# Patient Record
Sex: Female | Born: 1965 | Race: White | Hispanic: No | Marital: Married | Smoking: Current every day smoker
Health system: Southern US, Community
[De-identification: ages and names within clinical notes are randomized; demographics above are authoritative.]

## PROBLEM LIST (undated history)

## (undated) DIAGNOSIS — N838 Other noninflammatory disorders of ovary, fallopian tube and broad ligament: Secondary | ICD-10-CM

## (undated) DIAGNOSIS — J45909 Unspecified asthma, uncomplicated: Secondary | ICD-10-CM

## (undated) DIAGNOSIS — F329 Major depressive disorder, single episode, unspecified: Secondary | ICD-10-CM

## (undated) DIAGNOSIS — J189 Pneumonia, unspecified organism: Secondary | ICD-10-CM

## (undated) DIAGNOSIS — R519 Headache, unspecified: Secondary | ICD-10-CM

## (undated) DIAGNOSIS — E0590 Thyrotoxicosis, unspecified without thyrotoxic crisis or storm: Secondary | ICD-10-CM

## (undated) DIAGNOSIS — I10 Essential (primary) hypertension: Secondary | ICD-10-CM

## (undated) DIAGNOSIS — M1990 Unspecified osteoarthritis, unspecified site: Secondary | ICD-10-CM

## (undated) DIAGNOSIS — R51 Headache: Secondary | ICD-10-CM

## (undated) DIAGNOSIS — K219 Gastro-esophageal reflux disease without esophagitis: Secondary | ICD-10-CM

## (undated) DIAGNOSIS — E039 Hypothyroidism, unspecified: Secondary | ICD-10-CM

## (undated) DIAGNOSIS — F419 Anxiety disorder, unspecified: Secondary | ICD-10-CM

## (undated) DIAGNOSIS — F32A Depression, unspecified: Secondary | ICD-10-CM

## (undated) HISTORY — DX: Thyrotoxicosis, unspecified without thyrotoxic crisis or storm: E05.90

## (undated) HISTORY — PX: CHOLECYSTECTOMY: SHX55

## (undated) HISTORY — PX: TONSILLECTOMY: SUR1361

## (undated) HISTORY — PX: TUBAL LIGATION: SHX77

## (undated) HISTORY — PX: VARICOSE VEIN SURGERY: SHX832

## (undated) HISTORY — PX: WRIST SURGERY: SHX841

## (undated) HISTORY — PX: ABDOMINAL HYSTERECTOMY: SHX81

## (undated) HISTORY — PX: FINGER GANGLION CYST EXCISION: SHX1636

---

## 1998-06-30 ENCOUNTER — Other Ambulatory Visit: Admission: RE | Admit: 1998-06-30 | Discharge: 1998-06-30 | Payer: Self-pay | Admitting: Obstetrics & Gynecology

## 1999-01-22 ENCOUNTER — Encounter: Payer: Self-pay | Admitting: Vascular Surgery

## 1999-01-29 ENCOUNTER — Encounter (INDEPENDENT_AMBULATORY_CARE_PROVIDER_SITE_OTHER): Payer: Self-pay | Admitting: Specialist

## 1999-01-29 ENCOUNTER — Ambulatory Visit (HOSPITAL_COMMUNITY): Admission: RE | Admit: 1999-01-29 | Discharge: 1999-01-29 | Payer: Self-pay | Admitting: Vascular Surgery

## 1999-06-16 ENCOUNTER — Other Ambulatory Visit: Admission: RE | Admit: 1999-06-16 | Discharge: 1999-06-16 | Payer: Self-pay | Admitting: Obstetrics & Gynecology

## 1999-07-09 ENCOUNTER — Ambulatory Visit (HOSPITAL_COMMUNITY): Admission: RE | Admit: 1999-07-09 | Discharge: 1999-07-09 | Payer: Self-pay | Admitting: Obstetrics & Gynecology

## 2000-07-12 ENCOUNTER — Other Ambulatory Visit: Admission: RE | Admit: 2000-07-12 | Discharge: 2000-07-12 | Payer: Self-pay | Admitting: Obstetrics & Gynecology

## 2001-04-27 ENCOUNTER — Encounter (INDEPENDENT_AMBULATORY_CARE_PROVIDER_SITE_OTHER): Payer: Self-pay

## 2001-04-27 ENCOUNTER — Inpatient Hospital Stay (HOSPITAL_COMMUNITY): Admission: RE | Admit: 2001-04-27 | Discharge: 2001-04-29 | Payer: Self-pay | Admitting: Obstetrics & Gynecology

## 2003-03-27 ENCOUNTER — Encounter (INDEPENDENT_AMBULATORY_CARE_PROVIDER_SITE_OTHER): Payer: Self-pay | Admitting: *Deleted

## 2003-03-27 ENCOUNTER — Ambulatory Visit (HOSPITAL_COMMUNITY): Admission: RE | Admit: 2003-03-27 | Discharge: 2003-03-27 | Payer: Self-pay | Admitting: *Deleted

## 2005-07-22 ENCOUNTER — Other Ambulatory Visit: Admission: RE | Admit: 2005-07-22 | Discharge: 2005-07-22 | Payer: Self-pay | Admitting: Obstetrics & Gynecology

## 2007-11-09 ENCOUNTER — Ambulatory Visit (HOSPITAL_BASED_OUTPATIENT_CLINIC_OR_DEPARTMENT_OTHER): Admission: RE | Admit: 2007-11-09 | Discharge: 2007-11-09 | Payer: Self-pay | Admitting: Orthopedic Surgery

## 2007-11-09 ENCOUNTER — Encounter (INDEPENDENT_AMBULATORY_CARE_PROVIDER_SITE_OTHER): Payer: Self-pay | Admitting: Orthopedic Surgery

## 2009-03-07 ENCOUNTER — Ambulatory Visit: Payer: Self-pay | Admitting: Gastroenterology

## 2009-03-07 DIAGNOSIS — R109 Unspecified abdominal pain: Secondary | ICD-10-CM | POA: Insufficient documentation

## 2009-03-07 LAB — CONVERTED CEMR LAB
ALT: 16 units/L (ref 0–35)
AST: 18 units/L (ref 0–37)
Albumin: 4 g/dL (ref 3.5–5.2)
Alkaline Phosphatase: 60 units/L (ref 39–117)
BUN: 16 mg/dL (ref 6–23)
Basophils Absolute: 0 10*3/uL (ref 0.0–0.1)
Basophils Relative: 0.1 % (ref 0.0–3.0)
CO2: 29 meq/L (ref 19–32)
Calcium: 9.4 mg/dL (ref 8.4–10.5)
Chloride: 105 meq/L (ref 96–112)
Creatinine, Ser: 0.8 mg/dL (ref 0.4–1.2)
Eosinophils Absolute: 0 10*3/uL (ref 0.0–0.7)
Eosinophils Relative: 0.1 % (ref 0.0–5.0)
GFR calc non Af Amer: 82.94 mL/min (ref >60)
Glucose, Bld: 90 mg/dL (ref 70–99)
HCT: 37.5 % (ref 36.0–46.0)
Hemoglobin: 13.4 g/dL (ref 12.0–15.0)
IgA: 126 mg/dL (ref 68–378)
Lymphocytes Relative: 21.4 % (ref 12.0–46.0)
Lymphs Abs: 1.2 10*3/uL (ref 0.7–4.0)
MCHC: 35.7 g/dL (ref 30.0–36.0)
MCV: 88.7 fL (ref 78.0–100.0)
Monocytes Absolute: 0.3 10*3/uL (ref 0.1–1.0)
Monocytes Relative: 6.1 % (ref 3.0–12.0)
Neutro Abs: 4 10*3/uL (ref 1.4–7.7)
Neutrophils Relative %: 72.3 % (ref 43.0–77.0)
Platelets: 253 10*3/uL (ref 150.0–400.0)
Potassium: 3.7 meq/L (ref 3.5–5.1)
RBC: 4.23 M/uL (ref 3.87–5.11)
RDW: 13.2 % (ref 11.5–14.6)
Sodium: 140 meq/L (ref 135–145)
Tissue Transglutaminase Ab, IgA: 0.3 units (ref <7)
Total Bilirubin: 0.9 mg/dL (ref 0.3–1.2)
Total Protein: 7.1 g/dL (ref 6.0–8.3)
WBC: 5.5 10*3/uL (ref 4.5–10.5)

## 2009-04-08 ENCOUNTER — Telehealth: Payer: Self-pay | Admitting: Gastroenterology

## 2010-12-15 NOTE — Op Note (Signed)
Lynn Newman, Lynn Newman              ACCOUNT NO.:  [id number]   MEDICAL RECORD NO.:  [id number]          PATIENT TYPE:  AMB   LOCATION:  DSC                          FACILITY:  [facility]   PHYSICIAN:  Katy Fitch. Sypher, M.D. 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   OPERATION:  1. Excision of left trapezium with synovectomy of carpometacarpal      joint and removal of loose bodies.  2. A #1 Vicryl suture suspension plasty.  3. Reconstruction of index thumb intermetacarpal ligament utilizing a      free palmaris longus tendon graft.   OPERATING SURGEON:  Katy Fitch. Sypher, MD   ASSISTANT:  Lynn Reeks. Dasnoit, PA-C.   ANESTHESIA:  General by LMA technique supplemented by a left  interscalene block.   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 Jonny Ruiz Rendall for evaluation and  management of chronically painful left thumb CMC arthrosis.  Clinical  examination revealed crepitation and marked pain with CMC translation  grind and flexion extension.  Dr. Priscille Kluver had provided 2 steroid  injections without lasting relief.  I tried a third injection, placed  supine within the depth of the joint also without relief.  After  detailed informed consent during which Lynn Newman was advised of the  potential risks and benefits of [name], she was brought to the  operating at this time.   Preoperatively, we discussed in great detail the occasional occurrence  of a regional pain syndrome and/or reflex sympathetic dystrophy type  changes that can occur following  thumb suspension plasty.   While the etiology of this is unknown, there is an increased frequency  of this complication with the surgery.   Preoperatively, she is advised that there is a possibility that we would  need to provide sympathetic blocks and more therapy than originally  anticipated should this occur.   She also understands that there are risks of infection, failure of the  tendon graft, and possible neurovascular injury.   After questions were answered, she is brought to the operating room at  this time.   PROCEDURE:  Lynn Newman is brought to the operating room and placed  in supine position on the operating table.   Following an anesthesia consult with Dr. Sampson Goon, a left interscalene  block was placed for perioperative comfort.  There were no signs of  complication following the block.   Lynn Newman was brought to room 8 of the [facility], placed in  supine position on the operating table, and under Dr. Jarrett Ables  direct supervision, general anesthesia by LMA technique induced.   The left upper extremity was prepped with Betadine soap solution,  sterilely draped.  1 g of Ancef was administered as an IV prophylactic  antibiotic.   Procedure commenced with a short curvilinear incision paralleling the  glabrous skin margin of the thenar eminence.  Subcutaneous tissues were  carefully divided taking care to identify the radial superficial sensory  branches.  These were meticulously retracted.  The interval between the  abductor pollicis longus, dorsal slip, and the extensor pollicis brevis  were sharply developed revealing the capsule of the [facility] joint.  Taking  care to identify the radial artery, the trapezium was exposed  subperiosteally with sharp osteotomes and use of tenotomy scissors.  The  trapezium was morselized with a small osteotome and removed piecemeal  completely with a rongeur.  A synovectomy CMC joint was accomplished and  removal of  multiple loose bodies between the thumb metacarpal and index  metacarpal was accomplished.   Drill holes were created through the base of the thumb metacarpal  beginning 1 cm over the dorsal cortex and exiting out the central  portion of the metacarpal base and across the index metacarpal just  distal to the articular facet for the thumb metacarpal and extending to  the insertion of the extensor carpi radialis longus.   These were enlarged to 4 mm by sequential hand drilling.   A #1 Vicryl suture was then placed through the insertion of the extensor  carpi radialis longus, brought through the index metacarpal from dorsal  to palmar, and then up from proximal to distal through the base of the  thumb metacarpal to provide a suspension plasty.  The palmaris longus  was harvested through a short transverse incision at the distal wrist  flexion crease.  This was stripped of muscle and stored in a saline-  soaked sponge.  A double-stranded graft of the palmaris longus was then  placed through the drill hole in the base of the index metacarpal and  secured with a 2-0 FiberWire to the extensor carpi radialis longus  insertion followed by placement of the 2 free tails through the base of  the thumb metacarpal creating an intermetacarpal ligament.   One tail was then wrapped around the 2 tendon slips between the 2  metacarpals creating an overhand knot for interposition.  The free tail  was then woven into the abductor pollicis longus with a 2-pass palmar  tap weave followed by a square knot of the free ends beneath the thumb  metacarpal.   The Vicryl suture was tensioned to allow approximately 3 mm of play at  the intermetacarpal ligament reconstruction.   The Vicryl was tied creating a suspension plasty followed by finishing  the palmar tab weave with multiple horizontal mattress sutures of 3-0  Ethibond.   The capsule was then repaired with a series of 4-0 Vicryl sutures.  Bleeding  points were cauterized bipolar current followed by irrigation  wounds and closure with intradermal 2-0 Prolene and Steri-Strips.   A compressive dressings applied with a volar plaster splint and a radial  splint supporting the thumb in the palmar abducted position.   There were no apparent complications.   Ms. Slight tolerated the surgery and anesthesia well.  She was  transferred to recovery room in stable vital signs.      Katy Fitch Sypher, M.D.  Electronically Signed     RVS/MEDQ  D:  11/09/2007  T:  11/09/2007  Job:  [id number]

## 2010-12-18 NOTE — Discharge Summary (Signed)
[facility]  Patient:    Lynn Newman, Lynn Newman Visit Number: [id number] MRN: [id number]          Service Type: GYN Location: 9300 [id number] Attending Physician:  Mickle Mallory Dictated by:   Gerrit Friends. Aldona Bar, M.D. Admit Date:  04/27/2001 Disc. Date: 04/29/01                             Discharge Summary  DISCHARGE DIAGNOSIS: Dysmenorrhea and menorrhagia -- likely endometriosis and adenomyosis, final pathology pending.  PROCEDURE: Total abdominal hysterectomy.  SUMMARY:  This 45 year old, who had a previous cesarean section, is admitted for a total abdominal hysterectomy.  The cause is suspected endometriosis and adenomyosis -- her symptoms were dysmenorrhea and worsening menorrhagia unresponsive to oral contraceptives.  On September 26 she was taken to the operating room at which time she underwent a total abdominal hysterectomy.  The uterus appeared globular, and there was minimal, but noticeable, endometriosis on the bladder flap, and more so noticeable in the cul-de-sac.  The uterus did have the appearance of one consistent with adenomyosis.  The operative procedure went well.  Her postoperative course was benign.  She remained totally afebrile during her postoperative course.  Her discharge hemoglobin was 10.2 with a white count of 10,400 and and a platelet count of 259,000.  On the morning of 9/28 she was ambulating well, tolerating a regular diet well, having normal bowel and bladder function, was afebrile, and the wound was clean and dry and she was ready for discharge.  Accordingly she was given all appropriate instructions at the time of discharge and understood all instructions well.  The wound was clean and dry at the time of discharge -- her staples were removed and her wound was Steri-Striped with benzoin.   DISCHARGE MEDICATIONS:  Motrin 600 mg q.6h. as needed, and Tylox 1-2 every four to six hours as needed for severe pain.   She will also use ferrous sulfate 300 mg daily.  She will return to the office for follow up in approximately four weeks time.  FINAL PATHOLOGICAL REPORT:  Unavailable at the time of discharge.  CONDITION ON DISCHARGE:  Improved. Dictated by:   Gerrit Friends. Aldona Bar, M.D. Attending Physician:  Mickle Mallory DD:  04/29/01 TD:  04/29/01 Job: [id number]

## 2010-12-18 NOTE — H&P (Signed)
[facility]  Patient:    Lynn Newman, Lynn Newman Visit Number: [id number] MRN: [id number]          Service Type: Attending:  Gerrit Friends. Aldona Bar, M.D. Dictated by:   Gerrit Friends. Aldona Bar, M.D. Adm. Date:  04/27/01                           History and Physical  HISTORY:                      This patient is a 45 year old gravida 2, para 2 married white female who is admitted for total abdominal hysterectomy with a preoperative diagnosis of persistent menorrhagia and dysmenorrhea.  This patients second delivery was by cesarean section in 1995 and she underwent a laparoscopic tubal sterilization procedure in December 2000.  At the time of the laparoscopic tubal procedure, all was normal in the abdominal cavity.  About 5-6 months ago, as the patient began to lose weight, she noted developing menorrhagia.  It was discussed and birth control pills were tried to regulate her cycle to no avail.  She is desirous of definitive surgery and requested a total abdominal hysterectomy, understanding all the other options available.  She is admitted at this time for such procedure.  Her last episode of bleeding went from September 13 to September 20 and was significantly heavy and associated with cramping, as the past several have been.  She is on no regular medications.   She does have an allergy to SULFA. Her previous procedures include the C-section and tubal, as mentioned, as well as a vein stripping approximately two years ago by Dr. Arbie Cookey and a tonsillectomy at age 23.  The patient has had some significant weight loss--she was over 200 pounds approximately a year and a half ago and now is in the range of 175-180 pounds.  PAST MEDICAL HISTORY/SOCIAL HISTORY/FAMILY HISTORY:       Negative.  PHYSICAL EXAMINATION:  GENERAL:                      Examination at the time of admission finds a well-developed female in no distress.  VITAL SIGNS:                  Blood pressure  128/70, temperature 98.2, pulse 80 and regular, respirations 18 and regular.  HEENT:                        Negative.  NECK:                         Thyroid not enlarged.  CHEST:                        Clear to auscultation and percussion.  CARDIOVASCULAR:               Regular rhythm, no murmur.  BREASTS:                      Negative.  ABDOMEN:                      Negative.  No organomegaly.  Bowel sounds well heard.  There is a previous Pfannenstiel incision from a previous cesarean section.  EXTERNAL GENITALIA:           BUS normal,  entroitus by digital, vault fairly well supported.  Cervix:  No gross lesions (Pap smear December 2001 negative). Uterus normal size, does not pull down well, relatively immobile, nontender. Adnexal area negative.  Rectovaginal confirmatory.  EXTREMITIES:                  Negative.  NEUROLOGIC:                   Physiologic.  IMPRESSION:                   Persistent menorrhagia and dysmenorrhea, status post trial of birth control pills to no avail.  The patient desires definitive surgery.  PLAN:                         Total abdominal hysterectomy. Dictated by:   Gerrit Friends. Aldona Bar, M.D. Attending:  Gerrit Friends. Aldona Bar, M.D. DD:  04/26/01 TD:  04/26/01 Job: [id number]

## 2010-12-18 NOTE — Op Note (Signed)
[facility]  Patient:    Lynn Newman                      MRN: [id number] Proc. Date: 07/09/99 Adm. Date:  [id number] Attending:  Mickle Mallory                           Operative Report  PREOPERATIVE DIAGNOSIS:       Desire for permanent elective sterilization.  POSTOPERATIVE DIAGNOSIS:      Desire for permanent elective sterilization.  OPERATION:                    Laparoscopic tubal coagulation for permanent elective sterilization.  SURGEON:                      Gerrit Friends. Aldona Bar, M.D.  ASSISTANT:  ANESTHESIA:                   General endotracheal anesthesia.  ESTIMATED BLOOD LOSS:  INDICATIONS:                  This 45 year old, gravida 2, para 2, currently on  oral contraceptives is desirous of a permanent elective sterilization procedure. She is aware that such a procedure is meant to be 100% permanent, but unfortunately, is not 100% perfect - subsequent pregnancy can result. According to her wishes, she is now being taken to the operating room for a permanent elective sterilization procedure.  DESCRIPTION OF PROCEDURE:     The patient was taken to the operating room where  after the satisfactory induction of general endotracheal anesthesia, she was prepped and draped having been placed in the modified lithotomy position in the  short Allen stirrups.  She was prepped and draped in the usual fashion.  The bladder was drained of clear urine with a red rubber catheter in an in-and-out fashion as part of the procedure and a Hulka tenaculum was placed on the cervix for uterine mobility during the procedure.  At this time, the laparoscopy was begun in the usual fashion.  A 1 cm subumbilical midline transverse skin incision was made and through this a large trocar and sleeve was introduced.  The large trocar was removed and through the large sleeve, the laparoscope was introduced with good visualization of  intra-abdominal structures.  At this time a pneumoperitoneum was created with approximately 3 liters of carbon dioxide gas.  Under direct visualization through a 5 mm suprapubic midline transverse skin incision, accessory trocar and sleeve was introduced without difficulty.  The accessory trocar was removed and through the accessory  sleeve, the bipolar coagulating instrument, appropriately tested, was introduced. At this time the abdomen and pelvis was thoroughly inspected.  The undersurface of the diaphragm, liver edge, and gallbladder appeared normal.  The appendix was not visualized.  The bladder dome and cul-de-sac were normal.  The uterus itself was very normal as were both tubes and ovaries.  At this time, the right fallopian tube was identified, traced out to the fimbriated end for positive identification, and then in the midportion of the right fallopian tube an area measuring approximately 2 cm was adequately coagulated.  A similar  procedure was carried out on the left fallopian tube.  At this time, assured of  good coagulation of a segment of each fallopian tube, the procedure was felt to be complete.  The bipolar coagulating instrument was  removed from the accessory sleeve and the accessory sleeve removed from the abdomen without difficulty under direct visualization.  The undersurface of the peritoneum was noted to be dry.  At this time, the laparoscope was removed, the pneumoperitoneum was reduced, and large sleeve was then removed.  The skin incisions were closed with 4-0 Vicryl in interrupted subcuticular fashion and Band-aids were applied.  The Hulka tenaculum was removed from the cervix and at this time the patient was transported to the  recovery room in satisfactory condition having tolerated the procedure well. Estimated blood loss was negligible.  Sponge, needle, and instrument counts were correct x 2.  Pathologic specimen was none.  In summary, this  patient requested a permanent elective sterilization procedure  which was carried out without incident using the bipolar coagulating instrument. She will continue her oral contraceptives until she finishes this package.  She was given a prescription for Tylox to use one to two every four to six hours as needed for pain (#12) and Compazine rectal suppositories 25 mg to use one per rectum every six to eight hours as needed for severe nausea and vomiting (#4).  She will return to the office in approximately four weeks time and will be given a detailed instruction sheet at the time of discharge from the recovery room. DD:  07/09/99 TD:  07/09/99 Job: [id number]

## 2010-12-18 NOTE — Op Note (Signed)
[facility]  Patient:    Lynn Newman, Lynn Newman Visit Number: [id number] MRN: [id number]          Service Type: GYN Location: 9300 [id number] Attending Physician:  Mickle Mallory Dictated by:   Gerrit Friends. Aldona Bar, M.D. Proc. Date: 04/27/01 Admit Date:  04/27/2001                             Operative Report  PREOPERATIVE DIAGNOSES:       1. Dysmenorrhea.                               2. Menorrhagia.  POSTOPERATIVE DIAGNOSES:      1. Dysmenorrhea.                               2. Menorrhagia.                               3. Likely adenomyosis.                               4. Endometriosis.  PROCEDURE:                    Total abdominal hysterectomy.  SURGEON:                      Gerrit Friends. Aldona Bar, M.D.  ASSISTANT:                    Miguel Aschoff, M.D.  ANESTHESIA:                   General endotracheal.  DESCRIPTION OF PROCEDURE:     The patient was taken to the operating room. After the satisfactory induction of general endotracheal anesthesia, she was prepped and draped and placed in the supine position with a Foley catheter placed.  After the patient was adequately draped, the procedure was begun.  A Pfannenstiel incision was made and, with minimal difficulty, dissected down to the fascia, which was incised in a low transverse fashion.  Hemostasis was created at each layer.  A subfascial space was created inferiorly and superiorly.  The muscles were separated in the midline, the peritoneum identified and entered appropriately, with care taken to avoid the bowel superiorly and the bladder inferiorly.  At this time, inspection of the abdomen and pelvis was carried out.  The liver edge and the undersurface of the diaphragm felt normal.  The kidneys palpably were normal.  The appendix (was not actually identified until the end of the procedure) was noted to be retrocecal and was normal.  The uterus was lobular, upper limits of normal size,  consistent in general with the appearance of adenomyosis.  Both ovaries appeared normal.  There was a previous tubal ligation site noted on each fallopian tube.  There was some scattered endometriosis noted on the bladder flap anteriorly.  At this time, a self retaining OConnor-OSullivan retractor was placed and the bowel was packed off without difficulty.  Hysterectomy at this time was begun in the usual fashion.  The corners of the uterus were elevated with long Kelly clamps and the round ligaments identified, clamped, cut and suture secured  with 0 Vicryl suture.  The decision was made to leave both ovaries in. Therefore, the ovarian pedicles were isolated, clamped, cut and suture secured with 0 Vicryl suture x 2.  At this time, a bladder flap was created anteriorly and pushed off the lower uterine segment with ease.  There was some scarring in the cul-de-sac consistent with some deep endometriosis, as well.  Using curved Heaney clamps, the uterine artery pedicles were clamped, cut and suture secured with 0 Vicryl suture.  Continuing with straight Heaney clamps, the parametrial pedicles were taken in a similar fashion bilaterally down to the angle of the cervix.  At this time, with the posterior peritoneum pushed down and with the uterosacral pedicle on the left nicely identified, a curved Heaney clamp was placed, the pedicle cut, suture secured, and the angle of the vagina was entered.  Using the Satinsky scissors, the specimen was then excised including the entire cervix.  The specimen was passed off.  The cuff was exposed nicely and then the angle bilaterally.  A figure-of-eight 0 Vicryl suture was placed, creating excellent hemostasis.  The cuff was then reapproximated using figure-of-eight 0 Vicryl in an interrupted fashion. Profuse irrigation at this time was carried out.  During the isolation of the pedicles bilaterally, the ureters were palpated bilaterally and noted to  be within their normal courses well below the area of dissection.  At this time, once the pelvis was noted to be hemostatic, the defect that was created posteriorly was repair.  Essentially , the bladder flap was brought to cover over this area using a 2-0 Vicryl in a running fashion.  Again, hemostasis was noted to be adequate.  At this time, the procedure was felt to be complete.  Packs were removed.  All counts at this point were noted to be correct and no foreign bodies were noted to be remaining in the abdominal cavity.  The appendix was identified at this portion of the procedure. Thereafter, the retractor was removed.  Closure of the abdomen was carried out in layers.  The abdominal peritoneum was closed with 0 Vicryl in a running fashion.  The muscles were secured with the same.  Assured of subfascial hemostasis, the fascia was then approximated from angle to midline bilaterally using running 0 Vicryl suture.  The subcu tissue was rendered hemostatic. Staples were then used to close the skin.  Sterile pressure dressing was applied.  At this time, the patient was transported to the recovery room in satisfactory condition, having tolerated the procedure well.  ESTIMATED BLOOD LOSS:         150 cc.  SPONGE, NEEDLE AND INSTRUMENT COUNTS:        Correct x 2.  SUMMARY:                      This patient had dysmenorrhea and menorrhagia, which was unresponsive to oral contraceptives.  She likewise had had a previous sterilization procedure.  She requested definitive procedure and was taken to the operating room for total abdominal hysterectomy, which was carried out without incident.  Intraopratively, adenomyosis was suspected, and minimal endometriosis was noted - there was perhaps some significant endometriosis noted in the cul-de-sac.  DISPOSITION:                  The patient was taken to the recovery room in satisfactory condition as mentioned. Dictated by:   Gerrit Friends. Aldona Bar,  M.D. Attending Physician:  Mickle Mallory DD:  04/27/01 TD:  04/27/01 Job: [id number]

## 2011-04-27 LAB — POCT HEMOGLOBIN-HEMACUE: Hemoglobin: 13.3

## 2012-02-25 ENCOUNTER — Other Ambulatory Visit: Payer: Self-pay | Admitting: Obstetrics & Gynecology

## 2012-02-25 DIAGNOSIS — R928 Other abnormal and inconclusive findings on diagnostic imaging of breast: Secondary | ICD-10-CM

## 2012-03-01 ENCOUNTER — Ambulatory Visit
Admission: RE | Admit: 2012-03-01 | Discharge: 2012-03-01 | Disposition: A | Discharge status: Home / Self Care | Payer: BC Managed Care – PPO | Source: Ambulatory Visit | Attending: Obstetrics & Gynecology | Admitting: Obstetrics & Gynecology

## 2012-03-01 DIAGNOSIS — R928 Other abnormal and inconclusive findings on diagnostic imaging of breast: Secondary | ICD-10-CM

## 2012-07-10 ENCOUNTER — Other Ambulatory Visit: Payer: Self-pay | Admitting: Obstetrics and Gynecology

## 2012-07-10 DIAGNOSIS — R1032 Left lower quadrant pain: Secondary | ICD-10-CM

## 2012-07-13 ENCOUNTER — Ambulatory Visit
Admission: RE | Admit: 2012-07-13 | Discharge: 2012-07-13 | Disposition: A | Discharge status: Home / Self Care | Payer: BC Managed Care – PPO | Source: Ambulatory Visit | Attending: Obstetrics and Gynecology | Admitting: Obstetrics and Gynecology

## 2012-07-13 DIAGNOSIS — R1032 Left lower quadrant pain: Secondary | ICD-10-CM

## 2012-07-13 MED ORDER — IOHEXOL 300 MG/ML  SOLN
100.0000 mL | Freq: Once | INTRAMUSCULAR | Status: AC | PRN
  Administered 2012-07-13: 100 mL via INTRAVENOUS

## 2013-04-09 ENCOUNTER — Other Ambulatory Visit: Payer: Self-pay | Admitting: Obstetrics & Gynecology

## 2013-04-09 DIAGNOSIS — R928 Other abnormal and inconclusive findings on diagnostic imaging of breast: Secondary | ICD-10-CM

## 2013-04-26 ENCOUNTER — Other Ambulatory Visit: Payer: BC Managed Care – PPO

## 2013-05-08 ENCOUNTER — Other Ambulatory Visit: Payer: BC Managed Care – PPO

## 2013-06-01 ENCOUNTER — Other Ambulatory Visit: Payer: BC Managed Care – PPO

## 2013-06-27 ENCOUNTER — Ambulatory Visit
Admission: RE | Admit: 2013-06-27 | Discharge: 2013-06-27 | Disposition: A | Discharge status: Home / Self Care | Payer: BC Managed Care – PPO | Source: Ambulatory Visit | Attending: Obstetrics & Gynecology | Admitting: Obstetrics & Gynecology

## 2013-06-27 DIAGNOSIS — R928 Other abnormal and inconclusive findings on diagnostic imaging of breast: Secondary | ICD-10-CM

## 2014-03-29 ENCOUNTER — Other Ambulatory Visit: Payer: Self-pay

## 2014-03-29 DIAGNOSIS — Z1231 Encounter for screening mammogram for malignant neoplasm of breast: Secondary | ICD-10-CM

## 2014-04-10 ENCOUNTER — Ambulatory Visit
Admission: RE | Admit: 2014-04-10 | Discharge: 2014-04-10 | Disposition: A | Discharge status: Home / Self Care | Payer: BC Managed Care – PPO | Source: Ambulatory Visit

## 2014-04-10 DIAGNOSIS — Z1231 Encounter for screening mammogram for malignant neoplasm of breast: Secondary | ICD-10-CM

## 2017-08-04 NOTE — Progress Notes (Signed)
07-19-17 clearance chart Lynn Newman York, NP  ovn 07-19-17 chart  ekg 07-19-17 chart  Cmp, cbc/diff, pt, ptt 07-19-17 chart   MRSA 07-13-17 chart

## 2017-08-04 NOTE — Patient Instructions (Signed)
Lynn Newman  08/04/2017   Your procedure is scheduled on: 08-11-17  Report to [facility] Main  Entrance   Report to admitting at    1030 AM   Call this number if you have problems the morning of surgery [phone]    Remember: ONLY 1 PERSON MAY GO WITH YOU TO SHORT STAY TO GET  READY MORNING OF YOUR SURGERY.   Do not eat food :After Midnight.  You may have clear liquids until 0700 am then nothing by mouth     Take these medicines the morning of surgery with A SIP OF WATER:  DO NOT TAKE ANY DIABETIC MEDICATIONS DAY OF YOUR SURGERY                               You may not have any metal on your body including hair pins and              piercings  Do not wear jewelry, make-up, lotions, powders or perfumes, deodorant             Do not wear nail polish.  Do not shave  48 hours prior to surgery.              Do not bring valuables to the hospital. [place] IS NOT             RESPONSIBLE   FOR VALUABLES.  Contacts, dentures or bridgework may not be worn into surgery.  Leave suitcase in the car. After surgery it may be brought to your room.     Patients discharged the day of surgery will not be allowed to drive home.  Name and phone number of your driver:  Special Instructions: N/A              Please read over the following fact sheets you were given: _____________________________________________________________________             [facility] - Preparing for Surgery Before surgery, you can play an important role.  Because skin is not sterile, your skin needs to be as free of germs as possible.  You can reduce the number of germs on your skin by washing with CHG (chlorahexidine gluconate) soap before surgery.  CHG is an antiseptic cleaner which kills germs and bonds with the skin to continue killing germs even after washing. Please DO NOT use if you have an allergy to CHG or antibacterial soaps.  If your skin becomes reddened/irritated stop using  the CHG and inform your nurse when you arrive at Short Stay. Do not shave (including legs and underarms) for at least 48 hours prior to the first CHG shower.  You may shave your face/neck. Please follow these instructions carefully:  1.  Shower with CHG Soap the night before surgery and the  morning of Surgery.  2.  If you choose to wash your hair, wash your hair first as usual with your  normal  shampoo.  3.  After you shampoo, rinse your hair and body thoroughly to remove the  shampoo.                           4.  Use CHG as you would any other liquid soap.  You can apply chg directly  to the skin and wash  Gently with a scrungie or clean washcloth.  5.  Apply the CHG Soap to your body ONLY FROM THE NECK DOWN.   Do not use on face/ open                           Wound or open sores. Avoid contact with eyes, ears mouth and genitals (private parts).                       Wash face,  Genitals (private parts) with your normal soap.             6.  Wash thoroughly, paying special attention to the area where your surgery  will be performed.  7.  Thoroughly rinse your body with warm water from the neck down.  8.  DO NOT shower/wash with your normal soap after using and rinsing off  the CHG Soap.                9.  Pat yourself dry with a clean towel.            10.  Wear clean pajamas.            11.  Place clean sheets on your bed the night of your first shower and do not  sleep with pets. Day of Surgery : Do not apply any lotions/deodorants the morning of surgery.  Please wear clean clothes to the hospital/surgery center.  FAILURE TO FOLLOW THESE INSTRUCTIONS MAY RESULT IN THE CANCELLATION OF YOUR SURGERY PATIENT SIGNATURE_________________________________  NURSE SIGNATURE__________________________________  ________________________________________________________________________    CLEAR LIQUID DIET   Foods Allowed                                                                      Foods Excluded  Coffee and tea, regular and decaf                             liquids that you cannot  Plain Jell-O in any flavor                                             see through such as: Fruit ices (not with fruit pulp)                                     milk, soups, orange juice  Iced Popsicles                                    All solid food Carbonated beverages, regular and diet                                    Cranberry, grape and apple juices Sports drinks like Gatorade Lightly seasoned clear broth or consume(fat free) Sugar, honey syrup  _____________________________________________________________________   WHAT IS A BLOOD TRANSFUSION? Blood Transfusion Information  A transfusion is the replacement of blood or some of its parts. Blood is made up of multiple cells which provide different functions.  Red blood cells carry oxygen and are used for blood loss replacement.  White blood cells fight against infection.  Platelets control bleeding.  Plasma helps clot blood.  Other blood products are available for specialized needs, such as hemophilia or other clotting disorders. BEFORE THE TRANSFUSION  Who gives blood for transfusions?   Healthy volunteers who are fully evaluated to make sure their blood is safe. This is blood bank blood. Transfusion therapy is the safest it has ever been in the practice of medicine. Before blood is taken from a donor, a complete history is taken to make sure that person has no history of diseases nor engages in risky social behavior (examples are intravenous drug use or sexual activity with multiple partners). The donor's travel history is screened to minimize risk of transmitting infections, such as malaria. The donated blood is tested for signs of infectious diseases, such as HIV and hepatitis. The blood is then tested to be sure it is compatible with you in order to minimize the chance of a transfusion reaction. If you or  a relative donates blood, this is often done in anticipation of surgery and is not appropriate for emergency situations. It takes many days to process the donated blood. RISKS AND COMPLICATIONS Although transfusion therapy is very safe and saves many lives, the main dangers of transfusion include:   Getting an infectious disease.  Developing a transfusion reaction. This is an allergic reaction to something in the blood you were given. Every precaution is taken to prevent this. The decision to have a blood transfusion has been considered carefully by your caregiver before blood is given. Blood is not given unless the benefits outweigh the risks. AFTER THE TRANSFUSION  Right after receiving a blood transfusion, you will usually feel much better and more energetic. This is especially true if your red blood cells have gotten low (anemic). The transfusion raises the level of the red blood cells which carry oxygen, and this usually causes an energy increase.  The nurse administering the transfusion will monitor you carefully for complications. HOME CARE INSTRUCTIONS  No special instructions are needed after a transfusion. You may find your energy is better. Speak with your caregiver about any limitations on activity for underlying diseases you may have. SEEK MEDICAL CARE IF:   Your condition is not improving after your transfusion.  You develop redness or irritation at the intravenous (IV) site. SEEK IMMEDIATE MEDICAL CARE IF:  Any of the following symptoms occur over the next 12 hours:  Shaking chills.  You have a temperature by mouth above 102 F (38.9 C), not controlled by medicine.  Chest, back, or muscle pain.  People around you feel you are not acting correctly or are confused.  Shortness of breath or difficulty breathing.  Dizziness and fainting.  You get a rash or develop hives.  You have a decrease in urine output.  Your urine turns a dark color or changes to pink, red, or  brown. Any of the following symptoms occur over the next 10 days:  You have a temperature by mouth above 102 F (38.9 C), not controlled by medicine.  Shortness of breath.  Weakness after normal activity.  The white part of the eye turns yellow (jaundice).  You have a decrease in the amount of  urine or are urinating less often.  Your urine turns a dark color or changes to pink, red, or brown. Document Released: 07/16/2000 Document Revised: 10/11/2011 Document Reviewed: 03/04/2008 [employer] Patient Information 2014 [place], [place].  _______________________________________________________________________  Incentive Spirometer  An incentive spirometer is a tool that can help keep your lungs clear and active. This tool measures how well you are filling your lungs with each breath. Taking long deep breaths may help reverse or decrease the chance of developing breathing (pulmonary) problems (especially infection) following:  A long period of time when you are unable to move or be active. BEFORE THE PROCEDURE   If the spirometer includes an indicator to show your best effort, your nurse or respiratory therapist will set it to a desired goal.  If possible, sit up straight or lean slightly forward. Try not to slouch.  Hold the incentive spirometer in an upright position. INSTRUCTIONS FOR USE  1. Sit on the edge of your bed if possible, or sit up as far as you can in bed or on a chair. 2. Hold the incentive spirometer in an upright position. 3. Breathe out normally. 4. Place the mouthpiece in your mouth and seal your lips tightly around it. 5. Breathe in slowly and as deeply as possible, raising the piston or the ball toward the top of the column. 6. Hold your breath for 3-5 seconds or for as long as possible. Allow the piston or ball to fall to the bottom of the column. 7. Remove the mouthpiece from your mouth and breathe out normally. 8. Rest for a few seconds and repeat Steps 1  through 7 at least 10 times every 1-2 hours when you are awake. Take your time and take a few normal breaths between deep breaths. 9. The spirometer may include an indicator to show your best effort. Use the indicator as a goal to work toward during each repetition. 10. After each set of 10 deep breaths, practice coughing to be sure your lungs are clear. If you have an incision (the cut made at the time of surgery), support your incision when coughing by placing a pillow or rolled up towels firmly against it. Once you are able to get out of bed, walk around indoors and cough well. You may stop using the incentive spirometer when instructed by your caregiver.  RISKS AND COMPLICATIONS  Take your time so you do not get dizzy or light-headed.  If you are in pain, you may need to take or ask for pain medication before doing incentive spirometry. It is harder to take a deep breath if you are having pain. AFTER USE  Rest and breathe slowly and easily.  It can be helpful to keep track of a log of your progress. Your caregiver can provide you with a simple table to help with this. If you are using the spirometer at home, follow these instructions: SEEK MEDICAL CARE IF:   You are having difficultly using the spirometer.  You have trouble using the spirometer as often as instructed.  Your pain medication is not giving enough relief while using the spirometer.  You develop fever of 100.5 F (38.1 C) or higher. SEEK IMMEDIATE MEDICAL CARE IF:   You cough up bloody sputum that had not been present before.  You develop fever of 102 F (38.9 C) or greater.  You develop worsening pain at or near the incision site. MAKE SURE YOU:   Understand these instructions.  Will watch your condition.  Will get  help right away if you are not doing well or get worse. Document Released: 11/29/2006 Document Revised: 10/11/2011 Document Reviewed: 01/30/2007 [facility] Patient Information 2014 [place],  [place].   ________________________________________________________________________

## 2017-08-04 NOTE — Progress Notes (Signed)
Please place orders in epic  Pt. Has a preop 08-05-17 1100 am . Thank you!

## 2017-08-05 ENCOUNTER — Encounter (HOSPITAL_COMMUNITY)
Admission: RE | Admit: 2017-08-05 | Discharge: 2017-08-05 | Disposition: A | Discharge status: Home / Self Care | Payer: 59 | Source: Ambulatory Visit | Attending: Orthopedic Surgery | Admitting: Orthopedic Surgery

## 2017-08-05 ENCOUNTER — Other Ambulatory Visit: Payer: Self-pay

## 2017-08-05 ENCOUNTER — Encounter (HOSPITAL_COMMUNITY): Payer: Self-pay

## 2017-08-05 ENCOUNTER — Ambulatory Visit: Payer: Self-pay | Admitting: Orthopedic Surgery

## 2017-08-05 DIAGNOSIS — M1611 Unilateral primary osteoarthritis, right hip: Secondary | ICD-10-CM | POA: Diagnosis not present

## 2017-08-05 DIAGNOSIS — Z01818 Encounter for other preprocedural examination: Secondary | ICD-10-CM | POA: Diagnosis present

## 2017-08-05 HISTORY — DX: Depression, unspecified: F32.A

## 2017-08-05 HISTORY — DX: Unspecified osteoarthritis, unspecified site: M19.90

## 2017-08-05 HISTORY — DX: Anxiety disorder, unspecified: F41.9

## 2017-08-05 HISTORY — DX: Unspecified asthma, uncomplicated: J45.909

## 2017-08-05 HISTORY — DX: Major depressive disorder, single episode, unspecified: F32.9

## 2017-08-05 HISTORY — DX: Hypothyroidism, unspecified: E03.9

## 2017-08-05 HISTORY — DX: Headache: R51

## 2017-08-05 HISTORY — DX: Essential (primary) hypertension: I10

## 2017-08-05 HISTORY — DX: Headache, unspecified: R51.9

## 2017-08-05 HISTORY — DX: Gastro-esophageal reflux disease without esophagitis: K21.9

## 2017-08-05 LAB — ABO/RH: ABO/RH(D): O POS

## 2017-08-10 ENCOUNTER — Ambulatory Visit: Payer: Self-pay | Admitting: Orthopedic Surgery

## 2017-08-10 MED ORDER — TRANEXAMIC ACID 1000 MG/10ML IV SOLN
1000.0000 mg | INTRAVENOUS | Status: AC
  Administered 2017-08-11: 1000 mg via INTRAVENOUS
  Filled 2017-08-10: qty 1100

## 2017-08-10 NOTE — H&P (View-Only) (Signed)
TOTAL HIP ADMISSION H&P  Patient is admitted for right total hip arthroplasty.  Subjective:  Chief Complaint: right hip pain  HPI: Lynn Newman, [age] y.o. 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  Onset of symptoms was gradual starting 1 years ago with rapidlly worsening course since that time.The patient noted no past surgery on the right hip(s).  Patient currently rates pain in the right hip at 10 out of 10 with activity. Patient has night pain, worsening of pain with activity and weight bearing, trendelenberg gait, pain that interfers with activities of daily living and pain with passive range of motion. Patient has evidence of joint space narrowing and AVN by imaging studies. This condition presents safety issues increasing the risk of falls. There is no current active infection.  Patient Active Problem List   Diagnosis Date Noted  . ABDOMINAL PAIN OTHER SPECIFIED SITE 03/07/2009   Past Medical History:  Diagnosis Date  . Anxiety   . Arthritis   . Asthma   . Depression   . GERD (gastroesophageal reflux disease)   . Headache    migraines  . Hypertension   . Hypothyroidism     Past Surgical History:  Procedure Laterality Date  . ABDOMINAL HYSTERECTOMY     partial  . CESAREAN SECTION    . CHOLECYSTECTOMY    . TONSILLECTOMY    . VARICOSE VEIN SURGERY    . WRIST SURGERY     Left    Current Outpatient Medications  Medication Sig Dispense Refill Last Dose  . acetaminophen (TYLENOL) 500 MG tablet Take 1,000 mg by mouth every 4 (four) hours as needed (PAIN).     Marland Kitchen. CRESTOR 10 MG tablet Take 10 mg by mouth daily.  1   . diclofenac (VOLTAREN) 75 MG EC tablet Take 75 mg by mouth 2 (two) times  daily.  0   . Enalapril-Hydrochlorothiazide 5-12.5 MG tablet Take 1 tablet by mouth daily.  2   . gabapentin (NEURONTIN) 300 MG capsule Take 300 mg by mouth 3 (three) times daily.  1   . methimazole (TAPAZOLE) 5 MG tablet Take 5 mg by mouth daily.  1   . omeprazole (PRILOSEC) 40 MG capsule Take 40 mg by mouth 2 (two) times daily.  1   . venlafaxine XR (EFFEXOR-XR) 75 MG 24 hr capsule Take 75 mg by mouth daily.  1    No current facility-administered medications for this visit.    Allergies  Allergen Reactions  . Sulfonamide Derivatives Anaphylaxis and Hives    Social History   Tobacco Use  . Smoking status: Former Smoker    Last attempt to quit: 08/06/2015    Years since quitting: 2.0  . Smokeless tobacco: Never Used  Substance Use Topics  . Alcohol use: No    Frequency: Never    No family history on file.   Review of Systems  Constitutional: Positive for malaise/fatigue.  HENT: Negative.   Eyes: Negative.   Respiratory: Negative.   Cardiovascular: Positive for leg swelling.  Gastrointestinal: Negative.   Genitourinary: Negative.   Musculoskeletal: Positive for back pain, joint pain and myalgias.  Skin: Negative.   Neurological: Negative.   Endo/Heme/Allergies: Negative.   Psychiatric/Behavioral: Negative.     Objective:  Physical Exam  Vitals reviewed. Constitutional: She is oriented to person, place, and time. She appears well-developed and well-nourished.  HENT:  Head: Normocephalic and atraumatic.  Eyes: Conjunctivae and EOM are normal. Pupils are equal, round, and reactive to light.  Neck: Normal range of motion. Neck supple.  Cardiovascular: Normal rate, regular rhythm and intact distal pulses.  Respiratory: No respiratory distress.  GI: Soft. She exhibits no distension.  Genitourinary:  Genitourinary Comments: deferred  Musculoskeletal:       Right hip: She exhibits decreased range of motion, decreased strength and bony tenderness.  Neurological: She is  alert and oriented to person, place, and time. She has normal reflexes.  Skin: Skin is warm and dry.  Psychiatric: She has a normal mood and affect. Her behavior is normal. Judgment and thought content normal.    Vital signs in last 24 hours: @VSRANGES @  Labs:   Estimated body mass index is 36.85 kg/m as calculated from the following:   Height as of 08/05/17: 5\' 3"  (1.6 m).   Weight as of 08/05/17: 94.3 kg (208 lb).   Imaging Review Plain radiographs demonstrate severe degenerative joint disease of the right hip(s). The bone quality appears to be adequate for age and reported activity level.  Assessment/Plan:  AVN, right hip(s)  The patient history, physical examination, clinical judgement of the provider and imaging studies are consistent with end stage degenerative joint disease of the right hip(s) and total hip arthroplasty is deemed medically necessary. The treatment options including medical management, injection therapy, arthroscopy and arthroplasty were discussed at length. The risks and benefits of total hip arthroplasty were presented and reviewed. The risks due to aseptic loosening, infection, stiffness, dislocation/subluxation,  thromboembolic complications and other imponderables were discussed.  The patient acknowledged the explanation, agreed to proceed with the plan and consent was signed. Patient is being admitted for inpatient treatment for surgery, pain control, PT, OT, prophylactic antibiotics, VTE prophylaxis, progressive ambulation and ADL's and discharge planning.The patient is planning to be discharged home with HEP

## 2017-08-10 NOTE — H&P (Signed)
TOTAL HIP ADMISSION H&P  Patient is admitted for right total hip arthroplasty.  Subjective:  Chief Complaint: right hip pain  HPI: Lynn Newman, [age] y.o. 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  Onset of symptoms was gradual starting 1 years ago with rapidlly worsening course since that time.The patient noted no past surgery on the right hip(s).  Patient currently rates pain in the right hip at 10 out of 10 with activity. Patient has night pain, worsening of pain with activity and weight bearing, trendelenberg gait, pain that interfers with activities of daily living and pain with passive range of motion. Patient has evidence of joint space narrowing and AVN by imaging studies. This condition presents safety issues increasing the risk of falls. There is no current active infection.  Patient Active Problem List   Diagnosis Date Noted  . ABDOMINAL PAIN OTHER SPECIFIED SITE 03/07/2009   Past Medical History:  Diagnosis Date  . Anxiety   . Arthritis   . Asthma   . Depression   . GERD (gastroesophageal reflux disease)   . Headache    migraines  . Hypertension   . Hypothyroidism     Past Surgical History:  Procedure Laterality Date  . ABDOMINAL HYSTERECTOMY     partial  . CESAREAN SECTION    . CHOLECYSTECTOMY    . TONSILLECTOMY    . VARICOSE VEIN SURGERY    . WRIST SURGERY     Left    Current Outpatient Medications  Medication Sig Dispense Refill Last Dose  . acetaminophen (TYLENOL) 500 MG tablet Take 1,000 mg by mouth every 4 (four) hours as needed (PAIN).     Marland Kitchen. CRESTOR 10 MG tablet Take 10 mg by mouth daily.  1   . diclofenac (VOLTAREN) 75 MG EC tablet Take 75 mg by mouth 2 (two) times  daily.  0   . Enalapril-Hydrochlorothiazide 5-12.5 MG tablet Take 1 tablet by mouth daily.  2   . gabapentin (NEURONTIN) 300 MG capsule Take 300 mg by mouth 3 (three) times daily.  1   . methimazole (TAPAZOLE) 5 MG tablet Take 5 mg by mouth daily.  1   . omeprazole (PRILOSEC) 40 MG capsule Take 40 mg by mouth 2 (two) times daily.  1   . venlafaxine XR (EFFEXOR-XR) 75 MG 24 hr capsule Take 75 mg by mouth daily.  1    No current facility-administered medications for this visit.    Allergies  Allergen Reactions  . Sulfonamide Derivatives Anaphylaxis and Hives    Social History   Tobacco Use  . Smoking status: Former Smoker    Last attempt to quit: 08/06/2015    Years since quitting: 2.0  . Smokeless tobacco: Never Used  Substance Use Topics  . Alcohol use: No    Frequency: Never    No family history on file.   Review of Systems  Constitutional: Positive for malaise/fatigue.  HENT: Negative.   Eyes: Negative.   Respiratory: Negative.   Cardiovascular: Positive for leg swelling.  Gastrointestinal: Negative.   Genitourinary: Negative.   Musculoskeletal: Positive for back pain, joint pain and myalgias.  Skin: Negative.   Neurological: Negative.   Endo/Heme/Allergies: Negative.   Psychiatric/Behavioral: Negative.     Objective:  Physical Exam  Vitals reviewed. Constitutional: She is oriented to person, place, and time. She appears well-developed and well-nourished.  HENT:  Head: Normocephalic and atraumatic.  Eyes: Conjunctivae and EOM are normal. Pupils are equal, round, and reactive to light.  Neck: Normal range of motion. Neck supple.  Cardiovascular: Normal rate, regular rhythm and intact distal pulses.  Respiratory: No respiratory distress.  GI: Soft. She exhibits no distension.  Genitourinary:  Genitourinary Comments: deferred  Musculoskeletal:       Right hip: She exhibits decreased range of motion, decreased strength and bony tenderness.  Neurological: She is  alert and oriented to person, place, and time. She has normal reflexes.  Skin: Skin is warm and dry.  Psychiatric: She has a normal mood and affect. Her behavior is normal. Judgment and thought content normal.    Vital signs in last 24 hours: @VSRANGES @  Labs:   Estimated body mass index is 36.85 kg/m as calculated from the following:   Height as of 08/05/17: 5\' 3"  (1.6 m).   Weight as of 08/05/17: 94.3 kg (208 lb).   Imaging Review Plain radiographs demonstrate severe degenerative joint disease of the right hip(s). The bone quality appears to be adequate for age and reported activity level.  Assessment/Plan:  AVN, right hip(s)  The patient history, physical examination, clinical judgement of the provider and imaging studies are consistent with end stage degenerative joint disease of the right hip(s) and total hip arthroplasty is deemed medically necessary. The treatment options including medical management, injection therapy, arthroscopy and arthroplasty were discussed at length. The risks and benefits of total hip arthroplasty were presented and reviewed. The risks due to aseptic loosening, infection, stiffness, dislocation/subluxation,  thromboembolic complications and other imponderables were discussed.  The patient acknowledged the explanation, agreed to proceed with the plan and consent was signed. Patient is being admitted for inpatient treatment for surgery, pain control, PT, OT, prophylactic antibiotics, VTE prophylaxis, progressive ambulation and ADL's and discharge planning.The patient is planning to be discharged home with HEP

## 2017-08-11 ENCOUNTER — Inpatient Hospital Stay (HOSPITAL_COMMUNITY): Payer: 59 | Admitting: Anesthesiology

## 2017-08-11 ENCOUNTER — Encounter (HOSPITAL_COMMUNITY)
Admission: RE | Disposition: A | Discharge status: Home / Self Care | Payer: Self-pay | Source: Ambulatory Visit | Attending: Orthopedic Surgery

## 2017-08-11 ENCOUNTER — Other Ambulatory Visit: Payer: Self-pay

## 2017-08-11 ENCOUNTER — Encounter (HOSPITAL_COMMUNITY): Payer: Self-pay | Admitting: *Deleted

## 2017-08-11 ENCOUNTER — Inpatient Hospital Stay (HOSPITAL_COMMUNITY)
Admission: RE | Admit: 2017-08-11 | Discharge: 2017-08-12 | DRG: 470 | Disposition: A | Discharge status: Home / Self Care | Payer: 59 | Source: Ambulatory Visit | Attending: Orthopedic Surgery | Admitting: Orthopedic Surgery

## 2017-08-11 ENCOUNTER — Inpatient Hospital Stay (HOSPITAL_COMMUNITY): Payer: 59

## 2017-08-11 DIAGNOSIS — M1611 Unilateral primary osteoarthritis, right hip: Secondary | ICD-10-CM | POA: Diagnosis present

## 2017-08-11 DIAGNOSIS — Z90711 Acquired absence of uterus with remaining cervical stump: Secondary | ICD-10-CM | POA: Diagnosis not present

## 2017-08-11 DIAGNOSIS — Z87892 Personal history of anaphylaxis: Secondary | ICD-10-CM

## 2017-08-11 DIAGNOSIS — I10 Essential (primary) hypertension: Secondary | ICD-10-CM | POA: Diagnosis present

## 2017-08-11 DIAGNOSIS — Z9181 History of falling: Secondary | ICD-10-CM | POA: Diagnosis not present

## 2017-08-11 DIAGNOSIS — M879 Osteonecrosis, unspecified: Principal | ICD-10-CM | POA: Diagnosis present

## 2017-08-11 DIAGNOSIS — M87051 Idiopathic aseptic necrosis of right femur: Secondary | ICD-10-CM | POA: Diagnosis present

## 2017-08-11 DIAGNOSIS — Z79890 Hormone replacement therapy (postmenopausal): Secondary | ICD-10-CM | POA: Diagnosis not present

## 2017-08-11 DIAGNOSIS — Z419 Encounter for procedure for purposes other than remedying health state, unspecified: Secondary | ICD-10-CM

## 2017-08-11 DIAGNOSIS — Z882 Allergy status to sulfonamides status: Secondary | ICD-10-CM | POA: Diagnosis not present

## 2017-08-11 DIAGNOSIS — E039 Hypothyroidism, unspecified: Secondary | ICD-10-CM | POA: Diagnosis present

## 2017-08-11 DIAGNOSIS — J45909 Unspecified asthma, uncomplicated: Secondary | ICD-10-CM | POA: Diagnosis present

## 2017-08-11 DIAGNOSIS — F419 Anxiety disorder, unspecified: Secondary | ICD-10-CM | POA: Diagnosis present

## 2017-08-11 DIAGNOSIS — Z87891 Personal history of nicotine dependence: Secondary | ICD-10-CM

## 2017-08-11 DIAGNOSIS — Z09 Encounter for follow-up examination after completed treatment for conditions other than malignant neoplasm: Secondary | ICD-10-CM

## 2017-08-11 DIAGNOSIS — K219 Gastro-esophageal reflux disease without esophagitis: Secondary | ICD-10-CM | POA: Diagnosis present

## 2017-08-11 DIAGNOSIS — F329 Major depressive disorder, single episode, unspecified: Secondary | ICD-10-CM | POA: Diagnosis present

## 2017-08-11 HISTORY — PX: TOTAL HIP ARTHROPLASTY: SHX124

## 2017-08-11 LAB — TYPE AND SCREEN
ABO/RH(D): O POS
ANTIBODY SCREEN: NEGATIVE

## 2017-08-11 LAB — PROTIME-INR
INR: 0.87
Prothrombin Time: 11.8 seconds (ref 11.4–15.2)

## 2017-08-11 LAB — APTT: APTT: 33 s (ref 24–36)

## 2017-08-11 SURGERY — ARTHROPLASTY, HIP, TOTAL, ANTERIOR APPROACH
Anesthesia: Spinal | Site: Hip | Laterality: Right

## 2017-08-11 MED ORDER — KETOROLAC TROMETHAMINE 30 MG/ML IJ SOLN
INTRAMUSCULAR | Status: AC
  Filled 2017-08-11: qty 1

## 2017-08-11 MED ORDER — KETOROLAC TROMETHAMINE 15 MG/ML IJ SOLN
15.0000 mg | Freq: Four times a day (QID) | INTRAMUSCULAR | Status: AC
  Administered 2017-08-11 – 2017-08-12 (×4): 15 mg via INTRAVENOUS
  Filled 2017-08-11 (×4): qty 1

## 2017-08-11 MED ORDER — SENNA 8.6 MG PO TABS
2.0000 | ORAL_TABLET | Freq: Every day | ORAL | Status: DC
  Administered 2017-08-11: 21:00:00 17.2 mg via ORAL
  Filled 2017-08-11: qty 2

## 2017-08-11 MED ORDER — ISOPROPYL ALCOHOL 70 % SOLN
Status: DC | PRN
  Administered 2017-08-11: 1 via TOPICAL

## 2017-08-11 MED ORDER — MIDAZOLAM HCL 2 MG/2ML IJ SOLN
INTRAMUSCULAR | Status: DC | PRN
  Administered 2017-08-11: 2 mg via INTRAVENOUS

## 2017-08-11 MED ORDER — LIDOCAINE 2% (20 MG/ML) 5 ML SYRINGE
INTRAMUSCULAR | Status: AC
  Filled 2017-08-11: qty 5

## 2017-08-11 MED ORDER — ONDANSETRON HCL 4 MG/2ML IJ SOLN
INTRAMUSCULAR | Status: DC | PRN
  Administered 2017-08-11: 4 mg via INTRAVENOUS

## 2017-08-11 MED ORDER — ASPIRIN 81 MG PO CHEW
81.0000 mg | CHEWABLE_TABLET | Freq: Two times a day (BID) | ORAL | Status: DC
  Administered 2017-08-11 – 2017-08-12 (×2): 81 mg via ORAL
  Filled 2017-08-11 (×2): qty 1

## 2017-08-11 MED ORDER — PROPOFOL 500 MG/50ML IV EMUL
INTRAVENOUS | Status: DC | PRN
  Administered 2017-08-11: 50 ug/kg/min via INTRAVENOUS

## 2017-08-11 MED ORDER — HYDROCODONE-ACETAMINOPHEN 5-325 MG PO TABS
1.0000 | ORAL_TABLET | ORAL | Status: DC | PRN
  Filled 2017-08-11 (×2): qty 1

## 2017-08-11 MED ORDER — PROPOFOL 10 MG/ML IV BOLUS
INTRAVENOUS | Status: AC
Start: 2017-08-11 — End: ?
  Filled 2017-08-11: qty 60

## 2017-08-11 MED ORDER — METOCLOPRAMIDE HCL 5 MG PO TABS: 5.0000 mg | ORAL_TABLET | Freq: Three times a day (TID) | ORAL | Status: DC | PRN

## 2017-08-11 MED ORDER — OXYCODONE HCL 5 MG PO TABS: 5.0000 mg | ORAL_TABLET | Freq: Once | ORAL | Status: DC | PRN

## 2017-08-11 MED ORDER — HYDROCODONE-ACETAMINOPHEN 5-325 MG PO TABS
2.0000 | ORAL_TABLET | ORAL | Status: DC | PRN
  Administered 2017-08-11 – 2017-08-12 (×4): 2 via ORAL
  Filled 2017-08-11 (×3): qty 2

## 2017-08-11 MED ORDER — PANTOPRAZOLE SODIUM 40 MG PO TBEC
40.0000 mg | DELAYED_RELEASE_TABLET | Freq: Every day | ORAL | Status: DC
  Administered 2017-08-12: 09:00:00 40 mg via ORAL
  Filled 2017-08-11: qty 1

## 2017-08-11 MED ORDER — ONDANSETRON HCL 4 MG/2ML IJ SOLN
4.0000 mg | Freq: Four times a day (QID) | INTRAMUSCULAR | Status: DC | PRN
Start: 2017-08-11 — End: 2017-08-12

## 2017-08-11 MED ORDER — SODIUM CHLORIDE 0.9 % IR SOLN
Status: DC | PRN
  Administered 2017-08-11: 1000 mL

## 2017-08-11 MED ORDER — METHOCARBAMOL 1000 MG/10ML IJ SOLN
500.0000 mg | Freq: Four times a day (QID) | INTRAVENOUS | Status: DC | PRN
  Administered 2017-08-11: 500 mg via INTRAVENOUS
  Filled 2017-08-11: qty 550

## 2017-08-11 MED ORDER — CEFAZOLIN SODIUM-DEXTROSE 2-4 GM/100ML-% IV SOLN
2.0000 g | INTRAVENOUS | Status: AC
  Administered 2017-08-11: 2 g via INTRAVENOUS
  Filled 2017-08-11: qty 100

## 2017-08-11 MED ORDER — SODIUM CHLORIDE 0.9 % IR SOLN
Status: DC | PRN
  Administered 2017-08-11: 3000 mL

## 2017-08-11 MED ORDER — FENTANYL CITRATE (PF) 100 MCG/2ML IJ SOLN
INTRAMUSCULAR | Status: DC | PRN
  Administered 2017-08-11: 100 ug via INTRAVENOUS

## 2017-08-11 MED ORDER — METHOCARBAMOL 500 MG PO TABS
500.0000 mg | ORAL_TABLET | Freq: Four times a day (QID) | ORAL | Status: DC | PRN
  Administered 2017-08-12: 500 mg via ORAL
  Filled 2017-08-11: qty 1

## 2017-08-11 MED ORDER — ROSUVASTATIN CALCIUM 10 MG PO TABS
10.0000 mg | ORAL_TABLET | Freq: Every day | ORAL | Status: DC
  Administered 2017-08-12: 09:00:00 10 mg via ORAL
  Filled 2017-08-11: qty 2

## 2017-08-11 MED ORDER — LIDOCAINE 2% (20 MG/ML) 5 ML SYRINGE
INTRAMUSCULAR | Status: DC | PRN
  Administered 2017-08-11 (×2): 50 mg via INTRAVENOUS

## 2017-08-11 MED ORDER — FENTANYL CITRATE (PF) 100 MCG/2ML IJ SOLN
INTRAMUSCULAR | Status: AC
  Filled 2017-08-11: qty 2

## 2017-08-11 MED ORDER — ACETAMINOPHEN 650 MG RE SUPP: 650.0000 mg | RECTAL | Status: DC | PRN

## 2017-08-11 MED ORDER — ONDANSETRON HCL 4 MG/2ML IJ SOLN
INTRAMUSCULAR | Status: AC
  Filled 2017-08-11: qty 2

## 2017-08-11 MED ORDER — ENALAPRIL MALEATE 5 MG PO TABS
5.0000 mg | ORAL_TABLET | Freq: Every day | ORAL | Status: DC
  Administered 2017-08-11: 18:00:00 5 mg via ORAL
  Filled 2017-08-11 (×2): qty 1

## 2017-08-11 MED ORDER — OXYCODONE HCL 5 MG/5ML PO SOLN
5.0000 mg | Freq: Once | ORAL | Status: DC | PRN
  Filled 2017-08-11: qty 5

## 2017-08-11 MED ORDER — HYDROCHLOROTHIAZIDE 12.5 MG PO CAPS
12.5000 mg | ORAL_CAPSULE | Freq: Every day | ORAL | Status: DC
  Filled 2017-08-11: qty 1

## 2017-08-11 MED ORDER — CHLORHEXIDINE GLUCONATE 4 % EX LIQD: 60.0000 mL | Freq: Once | CUTANEOUS | Status: DC

## 2017-08-11 MED ORDER — HYDROMORPHONE HCL 1 MG/ML IJ SOLN
INTRAMUSCULAR | Status: AC
  Filled 2017-08-11: qty 1

## 2017-08-11 MED ORDER — PROPOFOL 10 MG/ML IV BOLUS
INTRAVENOUS | Status: AC
  Filled 2017-08-11: qty 20

## 2017-08-11 MED ORDER — VENLAFAXINE HCL ER 75 MG PO CP24
75.0000 mg | ORAL_CAPSULE | Freq: Every day | ORAL | Status: DC
  Administered 2017-08-12: 75 mg via ORAL
  Filled 2017-08-11: qty 1

## 2017-08-11 MED ORDER — SODIUM CHLORIDE 0.9 % IV SOLN: INTRAVENOUS | Status: DC

## 2017-08-11 MED ORDER — ACETAMINOPHEN 325 MG PO TABS: 650.0000 mg | ORAL_TABLET | ORAL | Status: DC | PRN

## 2017-08-11 MED ORDER — BUPIVACAINE-EPINEPHRINE 0.25% -1:200000 IJ SOLN
INTRAMUSCULAR | Status: DC | PRN
  Administered 2017-08-11: 30 mL

## 2017-08-11 MED ORDER — SODIUM CHLORIDE 0.9 % IJ SOLN
INTRAMUSCULAR | Status: AC
  Filled 2017-08-11: qty 50

## 2017-08-11 MED ORDER — PHENOL 1.4 % MT LIQD: 1.0000 | OROMUCOSAL | Status: DC | PRN

## 2017-08-11 MED ORDER — ENALAPRIL-HYDROCHLOROTHIAZIDE 5-12.5 MG PO TABS: 1.0000 | ORAL_TABLET | Freq: Every day | ORAL | Status: DC

## 2017-08-11 MED ORDER — SODIUM CHLORIDE 0.9 % IV SOLN
INTRAVENOUS | Status: DC
  Administered 2017-08-11: 17:00:00 via INTRAVENOUS

## 2017-08-11 MED ORDER — DIPHENHYDRAMINE HCL 12.5 MG/5ML PO ELIX: 12.5000 mg | ORAL_SOLUTION | ORAL | Status: DC | PRN

## 2017-08-11 MED ORDER — METOCLOPRAMIDE HCL 5 MG/ML IJ SOLN: 5.0000 mg | Freq: Three times a day (TID) | INTRAMUSCULAR | Status: DC | PRN

## 2017-08-11 MED ORDER — METHIMAZOLE 5 MG PO TABS
5.0000 mg | ORAL_TABLET | Freq: Every day | ORAL | Status: DC
  Administered 2017-08-12: 09:00:00 5 mg via ORAL
  Filled 2017-08-11: qty 1

## 2017-08-11 MED ORDER — HYDROMORPHONE HCL 1 MG/ML IJ SOLN
0.5000 mg | INTRAMUSCULAR | Status: DC | PRN
  Administered 2017-08-11 – 2017-08-12 (×2): 1 mg via INTRAVENOUS
  Filled 2017-08-11 (×2): qty 1

## 2017-08-11 MED ORDER — POLYETHYLENE GLYCOL 3350 17 G PO PACK: 17.0000 g | PACK | Freq: Every day | ORAL | Status: DC | PRN

## 2017-08-11 MED ORDER — MIDAZOLAM HCL 2 MG/2ML IJ SOLN
INTRAMUSCULAR | Status: AC
  Filled 2017-08-11: qty 2

## 2017-08-11 MED ORDER — SODIUM CHLORIDE 0.9 % IJ SOLN
INTRAMUSCULAR | Status: DC | PRN
  Administered 2017-08-11: 30 mL via INTRAVENOUS

## 2017-08-11 MED ORDER — DOCUSATE SODIUM 100 MG PO CAPS
100.0000 mg | ORAL_CAPSULE | Freq: Two times a day (BID) | ORAL | Status: DC
  Administered 2017-08-11 – 2017-08-12 (×2): 100 mg via ORAL
  Filled 2017-08-11 (×2): qty 1

## 2017-08-11 MED ORDER — BUPIVACAINE-EPINEPHRINE 0.25% -1:200000 IJ SOLN
INTRAMUSCULAR | Status: AC
  Filled 2017-08-11: qty 1

## 2017-08-11 MED ORDER — ISOPROPYL ALCOHOL 70 % SOLN
Status: AC
  Filled 2017-08-11: qty 480

## 2017-08-11 MED ORDER — ONDANSETRON HCL 4 MG PO TABS: 4.0000 mg | ORAL_TABLET | Freq: Four times a day (QID) | ORAL | Status: DC | PRN

## 2017-08-11 MED ORDER — LACTATED RINGERS IV SOLN
INTRAVENOUS | Status: DC
  Administered 2017-08-11 (×2): via INTRAVENOUS

## 2017-08-11 MED ORDER — KETOROLAC TROMETHAMINE 30 MG/ML IJ SOLN
INTRAMUSCULAR | Status: DC | PRN
  Administered 2017-08-11: 30 mg via INTRAVENOUS

## 2017-08-11 MED ORDER — WATER FOR IRRIGATION, STERILE IR SOLN
Status: DC | PRN
  Administered 2017-08-11: 2000 mL

## 2017-08-11 MED ORDER — DEXAMETHASONE SODIUM PHOSPHATE 10 MG/ML IJ SOLN
10.0000 mg | Freq: Once | INTRAMUSCULAR | Status: AC
  Administered 2017-08-12: 10 mg via INTRAVENOUS
  Filled 2017-08-11: qty 1

## 2017-08-11 MED ORDER — ALUM & MAG HYDROXIDE-SIMETH 200-200-20 MG/5ML PO SUSP
30.0000 mL | ORAL | Status: DC | PRN
Start: 2017-08-11 — End: 2017-08-12

## 2017-08-11 MED ORDER — HYDROMORPHONE HCL 1 MG/ML IJ SOLN
0.2500 mg | INTRAMUSCULAR | Status: DC | PRN
  Administered 2017-08-11 (×2): 0.5 mg via INTRAVENOUS

## 2017-08-11 MED ORDER — MENTHOL 3 MG MT LOZG: 1.0000 | LOZENGE | OROMUCOSAL | Status: DC | PRN

## 2017-08-11 MED ORDER — BUPIVACAINE IN DEXTROSE 0.75-8.25 % IT SOLN
INTRATHECAL | Status: DC | PRN
  Administered 2017-08-11: 1.6 mL via INTRATHECAL

## 2017-08-11 MED ORDER — ACETAMINOPHEN 10 MG/ML IV SOLN
1000.0000 mg | INTRAVENOUS | Status: AC
  Administered 2017-08-11: 1000 mg via INTRAVENOUS
  Filled 2017-08-11: qty 100

## 2017-08-11 MED ORDER — DEXAMETHASONE SODIUM PHOSPHATE 10 MG/ML IJ SOLN
INTRAMUSCULAR | Status: DC | PRN
  Administered 2017-08-11: 10 mg via INTRAVENOUS

## 2017-08-11 MED ORDER — CEFAZOLIN SODIUM-DEXTROSE 2-4 GM/100ML-% IV SOLN
2.0000 g | Freq: Four times a day (QID) | INTRAVENOUS | Status: AC
  Administered 2017-08-11 – 2017-08-12 (×2): 2 g via INTRAVENOUS
  Filled 2017-08-11 (×2): qty 100

## 2017-08-11 MED ORDER — GABAPENTIN 300 MG PO CAPS
300.0000 mg | ORAL_CAPSULE | Freq: Three times a day (TID) | ORAL | Status: DC
  Administered 2017-08-11 – 2017-08-12 (×3): 300 mg via ORAL
  Filled 2017-08-11 (×3): qty 1

## 2017-08-11 MED ORDER — DEXAMETHASONE SODIUM PHOSPHATE 10 MG/ML IJ SOLN
INTRAMUSCULAR | Status: AC
  Filled 2017-08-11: qty 1

## 2017-08-11 MED ORDER — POVIDONE-IODINE 10 % EX SWAB
2.0000 "application " | Freq: Once | CUTANEOUS | Status: AC
  Administered 2017-08-11: 2 via TOPICAL

## 2017-08-11 MED ORDER — ONDANSETRON HCL 4 MG/2ML IJ SOLN
4.0000 mg | Freq: Four times a day (QID) | INTRAMUSCULAR | Status: AC | PRN
  Administered 2017-08-11: 4 mg via INTRAVENOUS

## 2017-08-11 SURGICAL SUPPLY — 42 items
ADH SKN CLS APL DERMABOND .7 (GAUZE/BANDAGES/DRESSINGS) ×2
BAG SPEC THK2 15X12 ZIP CLS (MISCELLANEOUS)
BAG ZIPLOCK 12X15 (MISCELLANEOUS) IMPLANT
CAPT HIP TOTAL 2 ×2 IMPLANT
CHLORAPREP W/TINT 26ML (MISCELLANEOUS) ×3 IMPLANT
COVER PERINEAL POST (MISCELLANEOUS) ×3 IMPLANT
COVER SURGICAL LIGHT HANDLE (MISCELLANEOUS) ×3 IMPLANT
DECANTER SPIKE VIAL GLASS SM (MISCELLANEOUS) ×5 IMPLANT
DERMABOND ADVANCED (GAUZE/BANDAGES/DRESSINGS) ×4
DERMABOND ADVANCED .7 DNX12 (GAUZE/BANDAGES/DRESSINGS) ×2 IMPLANT
DRAPE SHEET LG 3/4 BI-LAMINATE (DRAPES) ×9 IMPLANT
DRAPE STERI IOBAN 125X83 (DRAPES) ×3 IMPLANT
DRAPE U-SHAPE 47X51 STRL (DRAPES) ×6 IMPLANT
DRSG AQUACEL AG ADV 3.5X10 (GAUZE/BANDAGES/DRESSINGS) ×3 IMPLANT
ELECT PENCIL ROCKER SW 15FT (MISCELLANEOUS) ×3 IMPLANT
ELECT REM PT RETURN 15FT ADLT (MISCELLANEOUS) ×3 IMPLANT
GAUZE SPONGE 4X4 12PLY STRL (GAUZE/BANDAGES/DRESSINGS) ×3 IMPLANT
GLOVE BIO SURGEON STRL SZ8.5 (GLOVE) ×6 IMPLANT
GLOVE BIOGEL PI IND STRL 8.5 (GLOVE) ×1 IMPLANT
GLOVE BIOGEL PI INDICATOR 8.5 (GLOVE) ×2
GOWN SPEC L3 XXLG W/TWL (GOWN DISPOSABLE) ×3 IMPLANT
HANDPIECE INTERPULSE COAX TIP (DISPOSABLE) ×3
HOLDER FOLEY CATH W/STRAP (MISCELLANEOUS) ×3 IMPLANT
HOOD PEEL AWAY FLYTE STAYCOOL (MISCELLANEOUS) ×12 IMPLANT
MARKER SKIN DUAL TIP RULER LAB (MISCELLANEOUS) ×3 IMPLANT
NDL SPNL 18GX3.5 QUINCKE PK (NEEDLE) ×1 IMPLANT
NEEDLE SPNL 18GX3.5 QUINCKE PK (NEEDLE) ×3 IMPLANT
PACK ANTERIOR HIP CUSTOM (KITS) ×3 IMPLANT
SAW OSC TIP CART 19.5X105X1.3 (SAW) ×3 IMPLANT
SEALER BIPOLAR AQUA 6.0 (INSTRUMENTS) ×3 IMPLANT
SET HNDPC FAN SPRY TIP SCT (DISPOSABLE) ×1 IMPLANT
SUT ETHIBOND NAB CT1 #1 30IN (SUTURE) ×6 IMPLANT
SUT MNCRL AB 3-0 PS2 18 (SUTURE) ×3 IMPLANT
SUT MON AB 2-0 CT1 36 (SUTURE) ×6 IMPLANT
SUT STRATAFIX PDO 1 14 VIOLET (SUTURE) ×3
SUT STRATFX PDO 1 14 VIOLET (SUTURE) ×1
SUT VIC AB 2-0 CT1 27 (SUTURE) ×3
SUT VIC AB 2-0 CT1 TAPERPNT 27 (SUTURE) ×1 IMPLANT
SUTURE STRATFX PDO 1 14 VIOLET (SUTURE) ×1 IMPLANT
SYR 50ML LL SCALE MARK (SYRINGE) ×3 IMPLANT
TRAY FOLEY CATH SILVER 14FR (SET/KITS/TRAYS/PACK) ×2 IMPLANT
YANKAUER SUCT BULB TIP 10FT TU (MISCELLANEOUS) ×3 IMPLANT

## 2017-08-11 NOTE — Interval H&P Note (Signed)
History and Physical Interval Note:  08/11/2017 11:05 AM  Lynn Newman  has presented today for surgery, with the diagnosis of Avascular necrosis right hip  The various methods of treatment have been discussed with the patient and family. After consideration of risks, benefits and other options for treatment, the patient has consented to  Procedure(s) with comments: RIGHT TOTAL HIP ARTHROPLASTY ANTERIOR APPROACH (Right) - Needs RNFA as a surgical intervention .  The patient's history has been reviewed, patient examined, no change in status, stable for surgery.  I have reviewed the patient's chart and labs.  Questions were answered to the patient's satisfaction.     Iline OvenBrian J Eben Choinski

## 2017-08-11 NOTE — Op Note (Signed)
OPERATIVE REPORT  SURGEON: Samson FredericBrian Aiden Helzer, MD   ASSISTANT: Skip MayerBlair Roberts, PA-C.  PREOPERATIVE DIAGNOSIS: Right hip avascular necrosis.   POSTOPERATIVE DIAGNOSIS: Right hip avascular necrosis.   PROCEDURE: Right total hip arthroplasty, anterior approach.   IMPLANTS: DePuy Tri Lock stem, size 3, std offset. DePuy Pinnacle Cup, size 50 mm. DePuy Altrx liner, size 50 by 32 mm, neutral. DePuy Biolox ceramic head ball, size 32 + 1 mm.  ANESTHESIA:  Spinal  ESTIMATED BLOOD LOSS:-200 mL    ANTIBIOTICS: 2 g Ancef.  DRAINS: None.  COMPLICATIONS: None.   CONDITION: PACU - hemodynamically stable.   BRIEF CLINICAL NOTE: Lynn Newman is a 52 y.o. female with a long-standing history of Right hip avascular necrosis. After failing conservative management, the patient was indicated for total hip arthroplasty. The risks, benefits, and alternatives to the procedure were explained, and the patient elected to proceed.  PROCEDURE IN DETAIL: Surgical site was marked by myself in the pre-op holding area. Once inside the operating room, spinal anesthesia was obtained, and a foley catheter was inserted. The patient was then positioned on the Hana table. All bony prominences were well padded. The hip was prepped and draped in the normal sterile surgical fashion. A time-out was called verifying side and site of surgery. The patient received IV antibiotics within 60 minutes of beginning the procedure.  The direct anterior approach to the hip was performed through the Hueter interval. Lateral femoral circumflex vessels were treated with the Auqumantys. The anterior capsule was exposed and an inverted T capsulotomy was made.The femoral neck cut was made to the level of the templated cut. A corkscrew was placed into the head and the head was removed. The femoral head was found to have eburnated bone. The head was passed to the back table and was measured.  Acetabular exposure was achieved, and  the pulvinar and labrum were excised. Sequential reaming of the acetabulum was then performed up to a size 49 mm reamer. A 50 mm cup was then opened and impacted into place at approximately 40 degrees of abduction and 20 degrees of anteversion. The final polyethylene liner was impacted into place and acetabular osteophytes were removed.   I then gained femoral exposure taking care to protect the abductors and greater trochanter. This was performed using standard external rotation, extension, and adduction. The capsule was peeled off the inner aspect of the greater trochanter, taking care to preserve the short external rotators. A cookie cutter was used to enter the femoral canal, and then the femoral canal finder was placed. Sequential broaching was performed up to a size 3. Calcar planer was used on the femoral neck remnant. I placed a std offset neck and a trial head ball. The hip was reduced. Leg lengths and offset were checked fluoroscopically. The hip was dislocated and trial components were removed. The final implants were placed, and the hip was reduced.  Fluoroscopy was used to confirm component position and leg lengths. At 90 degrees of external rotation and full extension, the hip was stable to an anterior directed force.  Lengthening of a few millimeters was require to restore adequate soft tissue tension.  The wound was copiously irrigated with normal saline using pulse lavage. Marcaine solution was injected into the periarticular soft tissue. The wound was closed in layers using #1 Vicryl and V-Loc for the fascia, 2-0 Vicryl for the subcutaneous fat, 2-0 Monocryl for the deep dermal layer, 3-0 running Monocryl subcuticular stitch, and Dermabond for the skin. Once the glue  was fully dried, an Aquacell Ag dressing was applied. The patient was transported to the recovery room in stable condition. Sponge, needle, and instrument counts were correct at the end of the case x2. The patient  tolerated the procedure well and there were no known complications.  Please note that a surgical assistant was a medical necessity for this procedure to perform it in a safe and expeditious manner. Assistant was necessary to provide appropriate retraction of vital neurovascular structures, to prevent femoral fracture, and to allow for anatomic placement of the prosthesis.

## 2017-08-11 NOTE — Discharge Instructions (Signed)
°Dr. Samanthia Howland °Joint Replacement Specialist °[facility] °[address] °[place], [place] [address] °[phone] ° ° °TOTAL HIP REPLACEMENT POSTOPERATIVE DIRECTIONS ° ° ° °Hip Rehabilitation, Guidelines Following Surgery  ° °WEIGHT BEARING °Weight bearing as tolerated with assist device (walker, cane, etc) as directed, use it as long as suggested by your surgeon or therapist, typically at least 4-6 weeks. ° °The results of a hip operation are greatly improved after range of motion and muscle strengthening exercises. Follow all safety measures which are given to protect your hip. If any of these exercises cause increased pain or swelling in your joint, decrease the amount until you are comfortable again. Then slowly increase the exercises. Call your caregiver if you have problems or questions.  ° °HOME CARE INSTRUCTIONS  °Most of the following instructions are designed to prevent the dislocation of your new hip.  °Remove items at home which could result in a fall. This includes throw rugs or furniture in walking pathways.  °Continue medications as instructed at time of discharge. °· You may have some home medications which will be placed on hold until you complete the course of blood thinner medication. °· You may start showering once you are discharged home. Do not remove your dressing. °Do not put on socks or shoes without following the instructions of your caregivers.   °Sit on chairs with arms. Use the chair arms to help push yourself up when arising.  °Arrange for the use of a toilet seat elevator so you are not sitting low.  °· Walk with walker as instructed.  °You may resume a sexual relationship in one month or when given the OK by your caregiver.  °Use walker as long as suggested by your caregivers.  °You may put full weight on your legs and walk as much as is comfortable. °Avoid periods of inactivity such as sitting longer than an hour when not asleep. This helps prevent  blood clots.  °You may return to work once you are cleared by your surgeon.  °Do not drive a car for 6 weeks or until released by your surgeon.  °Do not drive while taking narcotics.  °Wear elastic stockings for two weeks following surgery during the day but you may remove then at night.  °Make sure you keep all of your appointments after your operation with all of your doctors and caregivers. You should call the office at the above phone number and make an appointment for approximately two weeks after the date of your surgery. °Please pick up a stool softener and laxative for home use as long as you are requiring pain medications. °· ICE to the affected hip every three hours for 30 minutes at a time and then as needed for pain and swelling. Continue to use ice on the hip for pain and swelling from surgery. You may notice swelling that will progress down to the foot and ankle.  This is normal after surgery.  Elevate the leg when you are not up walking on it.   °It is important for you to complete the blood thinner medication as prescribed by your doctor. °· Continue to use the breathing machine which will help keep your temperature down.  It is common for your temperature to cycle up and down following surgery, especially at night when you are not up moving around and exerting yourself.  The breathing machine keeps your lungs expanded and your temperature down. ° °RANGE OF MOTION AND STRENGTHENING EXERCISES  °These exercises are   designed to help you keep full movement of your hip joint. Follow your caregiver's or physical therapist's instructions. Perform all exercises about fifteen times, three times per day or as directed. Exercise both hips, even if you have had only one joint replacement. These exercises can be done on a training (exercise) mat, on the floor, on a table or on a bed. Use whatever works the best and is most comfortable for you. Use music or television while you are exercising so that the exercises  are a pleasant break in your day. This will make your life better with the exercises acting as a break in routine you can look forward to.  °Lying on your back, slowly slide your foot toward your buttocks, raising your knee up off the floor. Then slowly slide your foot back down until your leg is straight again.  °Lying on your back spread your legs as far apart as you can without causing discomfort.  °Lying on your side, raise your upper leg and foot straight up from the floor as far as is comfortable. Slowly lower the leg and repeat.  °Lying on your back, tighten up the muscle in the front of your thigh (quadriceps muscles). You can do this by keeping your leg straight and trying to raise your heel off the floor. This helps strengthen the largest muscle supporting your knee.  °Lying on your back, tighten up the muscles of your buttocks both with the legs straight and with the knee bent at a comfortable angle while keeping your heel on the floor.  ° °SKILLED REHAB INSTRUCTIONS: °If the patient is transferred to a skilled rehab facility following release from the hospital, a list of the current medications will be sent to the facility for the patient to continue.  When discharged from the skilled rehab facility, please have the facility set up the patient's Home Health Physical Therapy prior to being released. Also, the skilled facility will be responsible for providing the patient with their medications at time of release from the facility to include their pain medication and their blood thinner medication. If the patient is still at the rehab facility at time of the two week follow up appointment, the skilled rehab facility will also need to assist the patient in arranging follow up appointment in our office and any transportation needs. ° °MAKE SURE YOU:  °Understand these instructions.  °Will watch your condition.  °Will get help right away if you are not doing well or get worse. ° °Pick up stool softner and  laxative for home use following surgery while on pain medications. °Do not remove your dressing. °The dressing is waterproof--it is OK to take showers. °Continue to use ice for pain and swelling after surgery. °Do not use any lotions or creams on the incision until instructed by your surgeon. °Total Hip Protocol. ° ° °

## 2017-08-11 NOTE — Anesthesia Procedure Notes (Signed)
Procedure Name: MAC Date/Time: 08/11/2017 1:13 PM Performed by: Dione Booze, CRNA Pre-anesthesia Checklist: Patient identified, Emergency Drugs available, Suction available and Patient being monitored Patient Re-evaluated:Patient Re-evaluated prior to induction Oxygen Delivery Method: Simple face mask Placement Confirmation: positive ETCO2

## 2017-08-11 NOTE — Transfer of Care (Signed)
Immediate Anesthesia Transfer of Care Note  Patient: Lynn Newman  Procedure(s) Performed: RIGHT TOTAL HIP ARTHROPLASTY ANTERIOR APPROACH (Right Hip)  Patient Location: PACU  Anesthesia Type:Spinal  Level of Consciousness: awake, alert  and oriented  Airway & Oxygen Therapy: Patient Spontanous Breathing and Patient connected to face mask oxygen  Post-op Assessment: Report given to RN and Post -op Vital signs reviewed and stable  Post vital signs: Reviewed and stable  Last Vitals:  Vitals:   08/11/17 1031  BP: 140/85  Pulse: 61  Resp: 18  Temp: 36.7 C  SpO2: 99%    Last Pain:  Vitals:   08/11/17 1059  TempSrc:   PainSc: 6          Complications: No apparent anesthesia complications

## 2017-08-11 NOTE — Anesthesia Postprocedure Evaluation (Signed)
Anesthesia Post Note  Patient: Lynn Newman  Procedure(s) Performed: RIGHT TOTAL HIP ARTHROPLASTY ANTERIOR APPROACH (Right Hip)     Patient location during evaluation: PACU Anesthesia Type: Spinal Level of consciousness: awake and alert Pain management: pain level controlled Vital Signs Assessment: post-procedure vital signs reviewed and stable Respiratory status: spontaneous breathing and respiratory function stable Cardiovascular status: blood pressure returned to baseline and stable Postop Assessment: spinal receding Anesthetic complications: no    Last Vitals:  Vitals:   08/11/17 1600 08/11/17 1629  BP: 121/80 129/72  Pulse: 64 (!) 58  Resp: 13 16  Temp: 36.6 C 36.5 C  SpO2: 100% 100%    Last Pain:  Vitals:   08/11/17 1605  TempSrc:   PainSc: 6                  Kennieth RadFitzgerald, Terisa Belardo E

## 2017-08-11 NOTE — Anesthesia Procedure Notes (Signed)
Spinal  Patient location during procedure: OR Start time: 08/11/2017 1:02 PM End time: 08/11/2017 1:08 PM Staffing Anesthesiologist: Achille RichHodierne, Serrina Minogue, MD Performed: anesthesiologist  Preanesthetic Checklist Completed: patient identified, surgical consent, pre-op evaluation, timeout performed, IV checked, risks and benefits discussed and monitors and equipment checked Spinal Block Patient position: sitting Prep: DuraPrep Patient monitoring: cardiac monitor, continuous pulse ox and blood pressure Approach: midline Location: L3-4 Injection technique: single-shot Needle Needle type: Pencan  Needle gauge: 24 G Needle length: 9 cm Assessment Sensory level: T10 Additional Notes Functioning IV was confirmed and monitors were applied. Sterile prep and drape, including hand hygiene and sterile gloves were used. The patient was positioned and the spine was prepped. The skin was anesthetized with lidocaine.  Free flow of clear CSF was obtained prior to injecting local anesthetic into the CSF.  The spinal needle aspirated freely following injection.  The needle was carefully withdrawn.  The patient tolerated the procedure well.

## 2017-08-11 NOTE — Anesthesia Preprocedure Evaluation (Signed)
Anesthesia Evaluation  Patient identified by MRN, date of birth, ID band Patient awake    Reviewed: Allergy & Precautions, H&P , NPO status , Patient's Chart, lab work & pertinent test results  Airway Mallampati: II   Neck ROM: full    Dental   Pulmonary asthma , former smoker,    breath sounds clear to auscultation       Cardiovascular hypertension,  Rhythm:regular Rate:Normal     Neuro/Psych  Headaches, PSYCHIATRIC DISORDERS Anxiety Depression    GI/Hepatic GERD  ,  Endo/Other  Hypothyroidism   Renal/GU      Musculoskeletal  (+) Arthritis ,   Abdominal   Peds  Hematology   Anesthesia Other Findings   Reproductive/Obstetrics                             Anesthesia Physical Anesthesia Plan  ASA: II  Anesthesia Plan: Spinal   Post-op Pain Management:    Induction: Intravenous  PONV Risk Score and Plan: 2 and Ondansetron, Propofol infusion, Treatment may vary due to age or medical condition and Midazolam  Airway Management Planned: Simple Face Mask  Additional Equipment:   Intra-op Plan:   Post-operative Plan:   Informed Consent: I have reviewed the patients History and Physical, chart, labs and discussed the procedure including the risks, benefits and alternatives for the proposed anesthesia with the patient or authorized representative who has indicated his/her understanding and acceptance.     Plan Discussed with: CRNA, Anesthesiologist and Surgeon  Anesthesia Plan Comments:         Anesthesia Quick Evaluation

## 2017-08-12 ENCOUNTER — Encounter (HOSPITAL_COMMUNITY): Payer: Self-pay | Admitting: Orthopedic Surgery

## 2017-08-12 LAB — CBC
HEMATOCRIT: 32.4 % — AB (ref 36.0–46.0)
Hemoglobin: 10.7 g/dL — ABNORMAL LOW (ref 12.0–15.0)
MCH: 29.7 pg (ref 26.0–34.0)
MCHC: 33 g/dL (ref 30.0–36.0)
MCV: 90 fL (ref 78.0–100.0)
Platelets: 272 10*3/uL (ref 150–400)
RBC: 3.6 MIL/uL — ABNORMAL LOW (ref 3.87–5.11)
RDW: 14.1 % (ref 11.5–15.5)
WBC: 11.3 10*3/uL — ABNORMAL HIGH (ref 4.0–10.5)

## 2017-08-12 LAB — BASIC METABOLIC PANEL
Anion gap: 5 (ref 5–15)
BUN: 14 mg/dL (ref 6–20)
CALCIUM: 8.7 mg/dL — AB (ref 8.9–10.3)
CHLORIDE: 106 mmol/L (ref 101–111)
CO2: 26 mmol/L (ref 22–32)
CREATININE: 0.77 mg/dL (ref 0.44–1.00)
GFR calc non Af Amer: >60 mL/min (ref >60)
GLUCOSE: 117 mg/dL — AB (ref 65–99)
Potassium: 4 mmol/L (ref 3.5–5.1)
Sodium: 137 mmol/L (ref 135–145)

## 2017-08-12 MED ORDER — SENNA 8.6 MG PO TABS: 2.0000 | ORAL_TABLET | Freq: Every day | ORAL | 0 refills | Status: DC

## 2017-08-12 MED ORDER — ASPIRIN 81 MG PO CHEW: 81.0000 mg | CHEWABLE_TABLET | Freq: Two times a day (BID) | ORAL | 1 refills | Status: DC

## 2017-08-12 MED ORDER — ONDANSETRON HCL 4 MG PO TABS
4.0000 mg | ORAL_TABLET | Freq: Four times a day (QID) | ORAL | 0 refills | Status: DC | PRN
Start: 2017-08-12 — End: 2020-02-19

## 2017-08-12 MED ORDER — HYDROCODONE-ACETAMINOPHEN 5-325 MG PO TABS: 1.0000 | ORAL_TABLET | ORAL | 0 refills | Status: DC | PRN

## 2017-08-12 MED ORDER — DOCUSATE SODIUM 100 MG PO CAPS: 100.0000 mg | ORAL_CAPSULE | Freq: Two times a day (BID) | ORAL | 1 refills | Status: DC

## 2017-08-12 NOTE — Progress Notes (Signed)
Spoke with patient at bedside. Confirmed plan for HEP. Needs RW and 3n1, contacted [facility] to deliver to the room. [phone]

## 2017-08-12 NOTE — Evaluation (Signed)
Physical Therapy Evaluation Patient Details Name: Lynn FilaShealah S Newman MRN: [id number] DOB: 11/05/1965 Today's Date: 08/12/2017   History of Present Illness  s/p R DA THA due to AVN  Clinical Impression  Pt s/p R THR and presents with decreased R LE strength/ROM and post op pain limiting functional mobility.  Pt should progress to dc home with family assist.    Follow Up Recommendations DC plan and follow up therapy as arranged by surgeon    Equipment Recommendations  3in1 (PT);Rolling walker with 5" wheels    Recommendations for Other Services OT consult     Precautions / Restrictions Precautions Precautions: Fall Restrictions Weight Bearing Restrictions: No      Mobility  Bed Mobility Overal bed mobility: Needs Assistance Bed Mobility: Supine to Sit     Supine to sit: Min assist     General bed mobility comments: cues for sequence and use of L LE to self assist  Transfers Overall transfer level: Needs assistance Equipment used: Rolling walker (2 wheeled) Transfers: Sit to/from Stand Sit to Stand: Min guard         General transfer comment: for safety  Ambulation/Gait Ambulation/Gait assistance: Min assist;Min guard Ambulation Distance (Feet): 100 Feet Assistive device: Rolling walker (2 wheeled) Gait Pattern/deviations: Step-to pattern;Step-through pattern;Decreased step length - right;Decreased step length - left;Shuffle;Trunk flexed Gait velocity: decr Gait velocity interpretation: Below normal speed for age/gender General Gait Details: cues for posture, position from RW and initial sequence  Stairs            Wheelchair Mobility    Modified Rankin (Stroke Patients Only)       Balance Overall balance assessment: No apparent balance deficits (not formally assessed)                                           Pertinent Vitals/Pain Pain Assessment: 0-10 Pain Score: 5  Pain Location: R hip Pain Descriptors / Indicators:  Sore Pain Intervention(s): Limited activity within patient's tolerance;Monitored during session;Premedicated before session;Ice applied    Home Living Family/patient expects to be discharged to:: Private residence Living Arrangements: Spouse/significant other Available Help at Discharge: Family Type of Home: House Home Access: Stairs to enter Entrance Stairs-Rails: None Secretary/administratorntrance Stairs-Number of Steps: 3 Home Layout: One level Home Equipment: None      Prior Function Level of Independence: Needs assistance   Gait / Transfers Assistance Needed: IND  ADL's / Homemaking Assistance Needed: assist for sock        Hand Dominance        Extremity/Trunk Assessment   Upper Extremity Assessment Upper Extremity Assessment: Overall WFL for tasks assessed    Lower Extremity Assessment Lower Extremity Assessment: RLE deficits/detail RLE Deficits / Details: Strength at hip 2+/5 with AAROM at hip to 80 flex and 15 abd    Cervical / Trunk Assessment Cervical / Trunk Assessment: Normal  Communication   Communication: No difficulties  Cognition Arousal/Alertness: Awake/alert Behavior During Therapy: WFL for tasks assessed/performed Overall Cognitive Status: Within Functional Limits for tasks assessed                                        General Comments      Exercises Total Joint Exercises Ankle Circles/Pumps: AROM;Both;15 reps;Supine Quad Sets: AROM;Both;10 reps;Supine Heel Slides: AAROM;Right;20 reps;Supine  Hip ABduction/ADduction: AAROM;Right;15 reps;Supine   Assessment/Plan    PT Assessment Patient needs continued PT services  PT Problem List Decreased strength;Decreased range of motion;Decreased activity tolerance;Decreased mobility;Decreased knowledge of use of DME;Pain       PT Treatment Interventions DME instruction;Gait training;Stair training;Functional mobility training;Therapeutic activities;Therapeutic exercise;Patient/family education     PT Goals (Current goals can be found in the Care Plan section)  Acute Rehab PT Goals Patient Stated Goal: be independent PT Goal Formulation: With patient Time For Goal Achievement: 08/13/17 Potential to Achieve Goals: Good    Frequency 7X/week   Barriers to discharge        Co-evaluation               AM-PAC PT "6 Clicks" Daily Activity  Outcome Measure Difficulty turning over in bed (including adjusting bedclothes, sheets and blankets)?: A Lot Difficulty moving from lying on back to sitting on the side of the bed? : A Lot Difficulty sitting down on and standing up from a chair with arms (e.g., wheelchair, bedside commode, etc,.)?: A Lot Help needed moving to and from a bed to chair (including a wheelchair)?: A Little Help needed walking in hospital room?: A Little Help needed climbing 3-5 steps with a railing? : A Little 6 Click Score: 15    End of Session Equipment Utilized During Treatment: Gait belt Activity Tolerance: Patient tolerated treatment well Patient left: in chair;with call bell/phone within reach;with family/visitor present Nurse Communication: Mobility status PT Visit Diagnosis: Difficulty in walking, not elsewhere classified (R26.2)    Time: 1324-4010 PT Time Calculation (min) (ACUTE ONLY): 30 min   Charges:   PT Evaluation $PT Eval Low Complexity: 1 Low PT Treatments $Therapeutic Exercise: 8-22 mins   PT G Codes:        Pg [phone]   Aayana Reinertsen 08/12/2017, 12:49 PM

## 2017-08-12 NOTE — Evaluation (Signed)
Occupational Therapy Evaluation Patient Details Name: Lynn Newman MRN: [id number] DOB: 11/11/1965 Today's Date: 08/12/2017    History of Present Illness s/p R DA THA due to AVN   Clinical Impression   This 52 year old female was admitted for the above.  All education was completed.  No further OT is needed at this time    Follow Up Recommendations  Supervision/Assistance - 24 hour    Equipment Recommendations  3 in 1 bedside commode(delivered)    Recommendations for Other Services       Precautions / Restrictions Precautions Precautions: Fall Restrictions Weight Bearing Restrictions: No      Mobility Bed Mobility               General bed mobility comments: oob  Transfers Overall transfer level: Needs assistance Equipment used: Rolling walker (2 wheeled) Transfers: Sit to/from Stand Sit to Stand: Min guard         General transfer comment: for safety    Balance                                           ADL either performed or assessed with clinical judgement   ADL Overall ADL's : Needs assistance/impaired     Grooming: Supervision/safety;Standing       Lower Body Bathing: Minimal assistance;Sit to/from stand       Lower Body Dressing: Moderate assistance;Sit to/from stand   Toilet Transfer: Min guard;Ambulation;BSC;RW   Toileting- ArchitectClothing Manipulation and Hygiene: Min guard;Sit to/from stand   Tub/ Shower Transfer: Minimal assistance;Tub transfer;Ambulation     General ADL Comments: pt can perform UB adls with setup. Simulated tub; min steadying assistance.  Husband will help with adls as needed. Educated on sidestepping through tight spaces. Pt tends to pick walker up during turns; cued to keep all 4 points on floor when turning     Vision         Perception     Praxis      Pertinent Vitals/Pain Pain Assessment: 0-10 Pain Score: 4  Pain Location: R hip Pain Descriptors / Indicators: Sore Pain  Intervention(s): Limited activity within patient's tolerance;Monitored during session;Premedicated before session;Repositioned     Hand Dominance     Extremity/Trunk Assessment Upper Extremity Assessment Upper Extremity Assessment: Overall WFL for tasks assessed           Communication     Cognition Arousal/Alertness: Awake/alert Behavior During Therapy: WFL for tasks assessed/performed Overall Cognitive Status: Within Functional Limits for tasks assessed                                     General Comments       Exercises     Shoulder Instructions      Home Living Family/patient expects to be discharged to:: Private residence Living Arrangements: Spouse/significant other Available Help at Discharge: Family               Bathroom Shower/Tub: Chief Strategy OfficerTub/shower unit   Bathroom Toilet: Standard                Prior Functioning/Environment Level of Independence: Needs assistance    ADL's / Homemaking Assistance Needed: assist for sock            OT Problem List:  OT Treatment/Interventions:      OT Goals(Current goals can be found in the care plan section) Acute Rehab OT Goals Patient Stated Goal: be independent OT Goal Formulation: All assessment and education complete, DC therapy  OT Frequency:     Barriers to D/C:            Co-evaluation              AM-PAC PT "6 Clicks" Daily Activity     Outcome Measure Help from another person eating meals?: None Help from another person taking care of personal grooming?: A Little Help from another person toileting, which includes using toliet, bedpan, or urinal?: A Little Help from another person bathing (including washing, rinsing, drying)?: A Little Help from another person to put on and taking off regular upper body clothing?: A Little Help from another person to put on and taking off regular lower body clothing?: A Lot 6 Click Score: 18   End of Session    Activity  Tolerance: Patient tolerated treatment well Patient left: in chair;with call bell/phone within reach;with family/visitor present  OT Visit Diagnosis: Pain Pain - Right/Left: Right Pain - part of body: Hip                Time: 1610-9604 OT Time Calculation (min): 14 min Charges:  OT General Charges $OT Visit: 1 Visit OT Evaluation $OT Eval Low Complexity: 1 Low G-Codes:     Villa Hugo I, OTR/L [phone] 08/12/2017  Lynn Newman 08/12/2017, 12:29 PM

## 2017-08-12 NOTE — Progress Notes (Signed)
Physical Therapy Treatment Patient Details Name: Lynn Newman MRN: [id number] DOB: 1966/07/09 Today's Date: 08/12/2017    History of Present Illness s/p R DA THA due to AVN    PT Comments    Pt progressing well with mobility and eager for dc home.  Spouse present and reviewed home therex with progression, stairs, and car transfers.   Follow Up Recommendations  DC plan and follow up therapy as arranged by surgeon     Equipment Recommendations  3in1 (PT);Rolling walker with 5" wheels    Recommendations for Other Services OT consult     Precautions / Restrictions Precautions Precautions: Fall Restrictions Weight Bearing Restrictions: No    Mobility  Bed Mobility Overal bed mobility: Needs Assistance Bed Mobility: Supine to Sit     Supine to sit: Min assist     General bed mobility comments: Pt up in chair and requests back to same  Transfers Overall transfer level: Needs assistance Equipment used: Rolling walker (2 wheeled) Transfers: Sit to/from Stand Sit to Stand: Supervision         General transfer comment: for safety  Ambulation/Gait Ambulation/Gait assistance: Min guard;Supervision Ambulation Distance (Feet): 100 Feet Assistive device: Rolling walker (2 wheeled) Gait Pattern/deviations: Step-to pattern;Step-through pattern;Shuffle;Trunk flexed Gait velocity: decr Gait velocity interpretation: Below normal speed for age/gender General Gait Details: cues for posture, position from RW and initial sequence   Stairs Stairs: Yes   Stair Management: No rails;Step to pattern;Backwards;Forwards;With walker Number of Stairs: 6 General stair comments: single step twice fwd with RW and 2 step twice bkwd with RW.  Spouse participating.  Written instruction provided  Wheelchair Mobility    Modified Rankin (Stroke Patients Only)       Balance Overall balance assessment: No apparent balance deficits (not formally assessed)                                          Cognition Arousal/Alertness: Awake/alert Behavior During Therapy: WFL for tasks assessed/performed Overall Cognitive Status: Within Functional Limits for tasks assessed                                        Exercises Total Joint Exercises Ankle Circles/Pumps: AROM;Both;15 reps;Supine Quad Sets: AROM;Both;10 reps;Supine Heel Slides: AAROM;Right;20 reps;Supine Hip ABduction/ADduction: AAROM;Right;15 reps;Supine Long Arc Quad: AROM;Right;10 reps;Supine    General Comments        Pertinent Vitals/Pain Pain Assessment: 0-10 Pain Score: 5  Pain Location: R hip Pain Descriptors / Indicators: Sore Pain Intervention(s): Limited activity within patient's tolerance;Monitored during session;Premedicated before session;Ice applied    Home Living Family/patient expects to be discharged to:: Private residence Living Arrangements: Spouse/significant other Available Help at Discharge: Family Type of Home: House Home Access: Stairs to enter Entrance Stairs-Rails: None Home Layout: One level Home Equipment: None      Prior Function Level of Independence: Needs assistance  Gait / Transfers Assistance Needed: IND ADL's / Homemaking Assistance Needed: assist for sock     PT Goals (current goals can now be found in the care plan section) Acute Rehab PT Goals Patient Stated Goal: be independent PT Goal Formulation: With patient Time For Goal Achievement: 08/13/17 Potential to Achieve Goals: Good Progress towards PT goals: Progressing toward goals    Frequency    7X/week  PT Plan Current plan remains appropriate    Co-evaluation              AM-PAC PT "6 Clicks" Daily Activity  Outcome Measure  Difficulty turning over in bed (including adjusting bedclothes, sheets and blankets)?: A Lot Difficulty moving from lying on back to sitting on the side of the bed? : A Lot Difficulty sitting down on and standing up from a  chair with arms (e.g., wheelchair, bedside commode, etc,.)?: A Lot Help needed moving to and from a bed to chair (including a wheelchair)?: A Little Help needed walking in hospital room?: A Little Help needed climbing 3-5 steps with a railing? : A Little 6 Click Score: 15    End of Session Equipment Utilized During Treatment: Gait belt Activity Tolerance: Patient tolerated treatment well Patient left: in chair;with call bell/phone within reach;with family/visitor present Nurse Communication: Mobility status PT Visit Diagnosis: Difficulty in walking, not elsewhere classified (R26.2)     Time: [id number] PT Time Calculation (min) (ACUTE ONLY): 38 min  Charges:  $Gait Training: 8-22 mins $Therapeutic Exercise: 8-22 mins $Therapeutic Activity: 8-22 mins                    G Codes:       Pg [phone]    Elijha Dedman 08/12/2017, 2:41 PM

## 2017-08-12 NOTE — Progress Notes (Signed)
   Subjective:  Patient reports pain as mild to moderate.  Denies N/V/CP/SOB.  Objective:   VITALS:   Vitals:   08/11/17 1930 08/11/17 2114 08/12/17 0032 08/12/17 0504  BP: (!) 115/59 117/64 109/60 (!) 101/59  Pulse: 64 (!) 59 (!) 57 61  Resp: 17 18 16 17   Temp: 97.7 F (36.5 C) 98 F (36.7 C) 98.1 F (36.7 C) 97.8 F (36.6 C)  TempSrc: Oral Oral Oral Oral  SpO2: 98% 99% 99% 100%  Weight:      Height:        NAD ABD soft Sensation intact distally Intact pulses distally Dorsiflexion/Plantar flexion intact Incision: dressing C/D/I Compartment soft   Lab Results  Component Value Date   WBC 11.3 (H) 08/12/2017   HGB 10.7 (L) 08/12/2017   HCT 32.4 (L) 08/12/2017   MCV 90.0 08/12/2017   PLT 272 08/12/2017   BMET    Component Value Date/Time   NA 137 08/12/2017 0526   K 4.0 08/12/2017 0526   CL 106 08/12/2017 0526   CO2 26 08/12/2017 0526   GLUCOSE 117 (H) 08/12/2017 0526   BUN 14 08/12/2017 0526   CREATININE 0.77 08/12/2017 0526   CALCIUM 8.7 (L) 08/12/2017 0526   GFRNONAA >60 08/12/2017 0526   GFRAA >60 08/12/2017 0526     Assessment/Plan: 1 Day Post-Op   Principal Problem:   Avascular necrosis of hip, right (HCC) Active Problems:   Avascular necrosis of bone of hip, right (HCC)   WBAT with walker ASA, SCDs, TEDs PO pain control PT/OT D/C home today with HEP   Iline OvenBrian J Katlyne Nishida 08/12/2017, 9:31 AM   Samson FredericBrian Akyah Lagrange, MD Cell [phone]

## 2017-08-12 NOTE — Progress Notes (Signed)
Patient discharged to home with family. Given all belongings, instructions, prescriptions, equipment. Verbalized understanding of instructions. Escorted to pov via w/c. 

## 2017-08-12 NOTE — Discharge Summary (Signed)
Physician Discharge Summary  Patient ID: Lynn Newman MRN: [id number] DOB/AGE: November 22, 1965 52 y.o.  Admit date: 08/11/2017 Discharge date: 08/12/2017  Admission Diagnoses:  Avascular necrosis of hip, right [facility])  Discharge Diagnoses:  Principal Problem:   Avascular necrosis of hip, right (HCC) Active Problems:   Avascular necrosis of bone of hip, right [facility])   Past Medical History:  Diagnosis Date  . Anxiety   . Arthritis   . Asthma   . Depression   . GERD (gastroesophageal reflux disease)   . Headache    migraines  . Hypertension   . Hypothyroidism     Surgeries: Procedure(s): RIGHT TOTAL HIP ARTHROPLASTY ANTERIOR APPROACH on 08/11/2017   Consultants (if any):   Discharged Condition: Improved  Hospital Course: Lynn Newman is an 52 y.o. female who was admitted 08/11/2017 with a diagnosis of Avascular necrosis of hip, right (HCC) and went to the operating room on 08/11/2017 and underwent the above named procedures.    She was given perioperative antibiotics:  Anti-infectives (From admission, onward)   Start     Dose/Rate Route Frequency Ordered Stop   08/11/17 1830  ceFAZolin (ANCEF) IVPB 2g/100 mL premix     2 g 200 mL/hr over 30 Minutes Intravenous Every 6 hours 08/11/17 1633 08/12/17 0222   08/11/17 1034  ceFAZolin (ANCEF) IVPB 2g/100 mL premix     2 g 200 mL/hr over 30 Minutes Intravenous On call to O.R. 08/11/17 1034 08/11/17 1319    .  She was given sequential compression devices, early ambulation, and ASA for DVT prophylaxis.  She benefited maximally from the hospital stay and there were no complications.    Recent vital signs:  Vitals:   08/12/17 0032 08/12/17 0504  BP: 109/60 (!) 101/59  Pulse: (!) 57 61  Resp: 16 17  Temp: 98.1 F (36.7 C) 97.8 F (36.6 C)  SpO2: 99% 100%    Recent laboratory studies:  Lab Results  Component Value Date   HGB 10.7 (L) 08/12/2017   HGB 13.4 03/07/2009   HGB 13.3 POINT OF CARE RESULT 11/09/2007   Lab  Results  Component Value Date   WBC 11.3 (H) 08/12/2017   PLT 272 08/12/2017   Lab Results  Component Value Date   INR 0.87 08/11/2017   Lab Results  Component Value Date   NA 137 08/12/2017   K 4.0 08/12/2017   CL 106 08/12/2017   CO2 26 08/12/2017   BUN 14 08/12/2017   CREATININE 0.77 08/12/2017   GLUCOSE 117 (H) 08/12/2017    Discharge Medications:   Allergies as of 08/12/2017      Reactions   Sulfonamide Derivatives Anaphylaxis, Hives      Medication List    STOP taking these medications   acetaminophen 500 MG tablet Commonly known as:  TYLENOL     TAKE these medications   aspirin 81 MG chewable tablet Chew 1 tablet (81 mg total) by mouth 2 (two) times daily.   CRESTOR 10 MG tablet Generic drug:  rosuvastatin Take 10 mg by mouth daily.   diclofenac 75 MG EC tablet Commonly known as:  VOLTAREN Take 75 mg by mouth 2 (two) times daily.   docusate sodium 100 MG capsule Commonly known as:  COLACE Take 1 capsule (100 mg total) by mouth 2 (two) times daily.   Enalapril-Hydrochlorothiazide 5-12.5 MG tablet Take 1 tablet by mouth daily.   gabapentin 300 MG capsule Commonly known as:  NEURONTIN Take 300 mg by mouth 3 (three)  times daily.   HYDROcodone-acetaminophen 5-325 MG tablet Commonly known as:  NORCO/VICODIN Take 1-2 tablets by mouth every 4 (four) hours as needed (hip pain).   methimazole 5 MG tablet Commonly known as:  TAPAZOLE Take 5 mg by mouth daily.   omeprazole 40 MG capsule Commonly known as:  PRILOSEC Take 40 mg by mouth 2 (two) times daily.   ondansetron 4 MG tablet Commonly known as:  ZOFRAN Take 1 tablet (4 mg total) by mouth every 6 (six) hours as needed for nausea.   senna 8.6 MG Tabs tablet Commonly known as:  SENOKOT Take 2 tablets (17.2 mg total) by mouth at bedtime.   venlafaxine XR 75 MG 24 hr capsule Commonly known as:  EFFEXOR-XR Take 75 mg by mouth daily.            Durable Medical Equipment  (From admission,  onward)        Start     Ordered   08/11/17 1633  DME Walker rolling  Once    Question:  Patient needs a walker to treat with the following condition  Answer:  Status post total hip replacement, right   08/11/17 1633      Diagnostic Studies: Dg Pelvis Portable  Result Date: 08/11/2017 CLINICAL DATA:  Status post right hip replacement EXAM: PORTABLE PELVIS 1-2 VIEWS COMPARISON:  None. FINDINGS: Right hip prosthesis is noted in satisfactory position. No acute bony or soft tissue abnormality is noted. IMPRESSION: Status post right hip replacement Electronically Signed   By: Alcide Clever M.D.   On: 08/11/2017 16:10   Dg C-arm 1-60 Min-no Report  Result Date: 08/11/2017 Fluoroscopy was utilized by the requesting physician.  No radiographic interpretation.   Dg Hip Operative Unilat W Or W/o Pelvis Right  Result Date: 08/11/2017 CLINICAL DATA:  Right hip replacement EXAM: OPERATIVE RIGHT HIP (WITH PELVIS IF PERFORMED)  VIEWS TECHNIQUE: Fluoroscopic spot image(s) were submitted for interpretation post-operatively. COMPARISON:  None. FINDINGS: Right hip replacement is noted in satisfactory position. IMPRESSION: Status post right hip replacement Electronically Signed   By: Alcide Clever M.D.   On: 08/11/2017 16:07    Disposition:   Discharge Instructions    Call MD / Call 911   Complete by:  As directed    If you experience chest pain or shortness of breath, CALL 911 and be transported to the hospital emergency room.  If you develope a fever above 101 F, pus (white drainage) or increased drainage or redness at the wound, or calf pain, call your surgeon's office.   Constipation Prevention   Complete by:  As directed    Drink plenty of fluids.  Prune juice may be helpful.  You may use a stool softener, such as Colace (over the counter) 100 mg twice a day.  Use MiraLax (over the counter) for constipation as needed.   Diet - low sodium heart healthy   Complete by:  As directed    Driving  restrictions   Complete by:  As directed    No driving for 6 weeks   Increase activity slowly as tolerated   Complete by:  As directed    Lifting restrictions   Complete by:  As directed    No lifting for 6 weeks   TED hose   Complete by:  As directed    Use stockings (TED hose) for 2 weeks on both leg(s).  You may remove them at night for sleeping.      Follow-up Information  Margaretha Mahan, Arlys JohnBrian, MD. Schedule an appointment as soon as possible for a visit in 2 weeks.   Specialty:  Orthopedic Surgery Why:  For wound re-check Contact information: [address] [place] [address] [phone]            Signed: Iline OvenBrian J Daran Favaro 08/12/2017, 9:35 AM

## 2017-10-17 ENCOUNTER — Other Ambulatory Visit: Payer: Self-pay | Admitting: Family

## 2017-10-17 DIAGNOSIS — Z1231 Encounter for screening mammogram for malignant neoplasm of breast: Secondary | ICD-10-CM

## 2018-01-19 ENCOUNTER — Ambulatory Visit
Admission: RE | Admit: 2018-01-19 | Discharge: 2018-01-19 | Disposition: A | Discharge status: Home / Self Care | Payer: 59 | Source: Ambulatory Visit | Attending: Family | Admitting: Family

## 2018-01-19 DIAGNOSIS — Z1231 Encounter for screening mammogram for malignant neoplasm of breast: Secondary | ICD-10-CM

## 2019-03-07 ENCOUNTER — Other Ambulatory Visit: Payer: Self-pay | Admitting: Family

## 2019-03-07 DIAGNOSIS — Z1231 Encounter for screening mammogram for malignant neoplasm of breast: Secondary | ICD-10-CM

## 2019-04-19 ENCOUNTER — Ambulatory Visit
Admission: RE | Admit: 2019-04-19 | Discharge: 2019-04-19 | Disposition: A | Discharge status: Home / Self Care | Payer: 59 | Source: Ambulatory Visit | Attending: Family | Admitting: Family

## 2019-04-19 ENCOUNTER — Other Ambulatory Visit: Payer: Self-pay

## 2019-04-19 DIAGNOSIS — Z1231 Encounter for screening mammogram for malignant neoplasm of breast: Secondary | ICD-10-CM

## 2019-09-01 ENCOUNTER — Other Ambulatory Visit: Payer: Self-pay | Admitting: Family

## 2019-09-01 DIAGNOSIS — Z1231 Encounter for screening mammogram for malignant neoplasm of breast: Secondary | ICD-10-CM

## 2020-02-17 NOTE — Progress Notes (Signed)
Cardiology Office Note:   Date:  02/19/2020  NAME:  Lynn Newman    MRN: [id number] DOB:  07/19/1966   PCP:  Dema SeverinYork, Regina F, NP  Cardiologist:  No primary care provider on file.   Referring MD: Dema SeverinYork, Regina F, NP   Chief Complaint  Patient presents with  . Hyperlipidemia   History of Present Illness:   Lynn Newman is a 54 y.o. female with a hx of anxiety, HLD, hypothyroidism on methimazole, rheumatoid arthritis who is being seen today for the evaluation of HLD at the request of York, Regina F, NP.  She reports has been told she is had high cholesterol for years.  Apparently she is only on 10 mg of Crestor.  Her direct LDL from her primary care physician office is 200.  She reports that her brother has high cholesterol.  She has 2 children but she does not know if they have high cholesterol.  She does have a history of heart attacks in her father in his 2760s.  She apparently has grandparents on both sides of had heart attacks.  Her brother has not had a heart attack or stroke.  Her EKG is within normal limits.  She reports she does smoke about 5 cigarettes/day.  She smoked for about 10 years.  She also has hypertension.  Blood pressure well controlled on enalapril and HCTZ.  She is recently diagnosed with rheumatoid arthritis.  [facility].  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  She gets no chest pain or shortness of breath with activity.  She does have the aches and pains of caring for small children.  She reports multiple arthritis issues.  She is had several surgeries including hip surgery.  No cardiac surgery reported.  She denies chest pain, shortness of breath or palpitations today.  Labs from primary care physician demonstrate total cholesterol 291, LDL direct 200, HDL 58, triglycerides 161, A1c 5.5, serum creatinine 0.95, TSH 1.3  Problem list 1.  Hyperthyroidism -On methimazole 2.  Rheumatoid arthritis 3.  Hyperlipidemia  (likely familial hypercholesterolemia) -Total cholesterol 291, direct LDL 200, HDL 58, triglycerides 161 -A1c 5.5 4.  Hypertension    Past Medical History: Past Medical History:  Diagnosis Date  . Anxiety   . Arthritis   . Arthritis   . Asthma   . Depression   . GERD (gastroesophageal reflux disease)   . Headache    migraines  . Hypertension   . Hyperthyroidism   . Hypothyroidism     Past Surgical History: Past Surgical History:  Procedure Laterality Date  . ABDOMINAL HYSTERECTOMY     partial  . CESAREAN SECTION    . CHOLECYSTECTOMY    . TONSILLECTOMY    . TOTAL HIP ARTHROPLASTY Right 08/11/2017   Procedure: RIGHT TOTAL HIP ARTHROPLASTY ANTERIOR APPROACH;  Surgeon: Samson FredericSwinteck, Brian, MD;  Location: [facility];  Service: Orthopedics;  Laterality: Right;  Needs RNFA  . VARICOSE VEIN SURGERY    . WRIST SURGERY     Left    Current Medications: Current Meds  Medication Sig  . CRESTOR 10 MG tablet Take 10 mg by mouth daily.  . diclofenac (VOLTAREN) 75 MG EC tablet Take 75 mg by mouth 2 (two) times daily.  . Enalapril-Hydrochlorothiazide 5-12.5 MG tablet Take 1 tablet by mouth daily.  Marland Kitchen. HYDROcodone-acetaminophen (NORCO/VICODIN) 5-325 MG tablet Take 1-2 tablets by mouth every 4 (four) hours as needed (hip pain).  . methimazole (TAPAZOLE)  5 MG tablet Take 5 mg by mouth daily.  Marland Kitchen omeprazole (PRILOSEC) 40 MG capsule Take 40 mg by mouth 2 (two) times daily.  Marland Kitchen venlafaxine XR (EFFEXOR-XR) 75 MG 24 hr capsule Take 75 mg by mouth daily.  . [DISCONTINUED] aspirin 81 MG chewable tablet Chew 1 tablet (81 mg total) by mouth 2 (two) times daily.  . [DISCONTINUED] docusate sodium (COLACE) 100 MG capsule Take 1 capsule (100 mg total) by mouth 2 (two) times daily.  . [DISCONTINUED] gabapentin (NEURONTIN) 300 MG capsule Take 300 mg by mouth 3 (three) times daily.  . [DISCONTINUED] ondansetron (ZOFRAN) 4 MG tablet Take 1 tablet (4 mg total) by mouth every 6 (six) hours as needed for nausea.  .  [DISCONTINUED] senna (SENOKOT) 8.6 MG TABS tablet Take 2 tablets (17.2 mg total) by mouth at bedtime.     Allergies:    Sulfonamide derivatives   Social History: Social History   Socioeconomic History  . Marital status: Married    Spouse name: Not on file  . Number of children: Not on file  . Years of education: Not on file  . Highest education level: Not on file  Occupational History  . Occupation: daycare  Tobacco Use  . Smoking status: Current Every Day Smoker    Years: 10.00    Last attempt to quit: 08/06/2015    Years since quitting: 4.5  . Smokeless tobacco: Never Used  Vaping Use  . Vaping Use: Never used  Substance and Sexual Activity  . Alcohol use: No  . Drug use: No  . Sexual activity: Yes  Other Topics Concern  . Not on file  Social History Narrative  . Not on file   Social Determinants of Health   Financial Resource Strain:   . Difficulty of Paying Living Expenses:   Food Insecurity:   . Worried About Programme researcher, broadcasting/film/video in the Last Year:   . Barista in the Last Year:   Transportation Needs:   . Freight forwarder (Medical):   Marland Kitchen Lack of Transportation (Non-Medical):   Physical Activity:   . Days of Exercise per Week:   . Minutes of Exercise per Session:   Stress:   . Feeling of Stress :   Social Connections:   . Frequency of Communication with Friends and Family:   . Frequency of Social Gatherings with Friends and Family:   . Attends Religious Services:   . Active Member of Clubs or Organizations:   . Attends Banker Meetings:   Marland Kitchen Marital Status:      Family History: The patient's family history includes Heart attack in her maternal grandfather and paternal grandfather; Heart attack (age of onset: 62) in her father; Hyperlipidemia in her brother; Hypertension in her mother. There is no history of Breast cancer.  ROS:   All other ROS reviewed and negative. Pertinent positives noted in the HPI.     EKGs/Labs/Other  Studies Reviewed:   The following studies were personally reviewed by me today:  EKG:  EKG is ordered today.  The ekg ordered today demonstrates normal sinus rhythm, heart rate 64, no acute ST-T changes, no evidence of prior infarction, and was personally reviewed by me.   Recent Labs: No results found for requested labs within last 8760 hours.   Recent Lipid Panel No results found for: CHOL, TRIG, HDL, CHOLHDL, VLDL, LDLCALC, LDLDIRECT  Physical Exam:   VS:  BP 122/69   Pulse 64   Temp Marland Kitchen)  96.4 F (35.8 C)   Ht 5\' 3"  (1.6 m)   Wt 209 lb (94.8 kg)   SpO2 94%   BMI 37.02 kg/m    Wt Readings from Last 3 Encounters:  02/19/20 209 lb (94.8 kg)  08/11/17 208 lb (94.3 kg)  08/05/17 208 lb (94.3 kg)    General: Well nourished, well developed, in no acute distress Heart: Atraumatic, normal size  Eyes: PEERLA, EOMI  Neck: Supple, no JVD Endocrine: No thryomegaly Cardiac: Normal S1, S2; RRR; no murmurs, rubs, or gallops Lungs: Clear to auscultation bilaterally, no wheezing, rhonchi or rales  Abd: Soft, nontender, no hepatomegaly  Ext: No edema, pulses 2+ Musculoskeletal: No deformities, BUE and BLE strength normal and equal Skin: Warm and dry, no rashes   Neuro: Alert and oriented to person, place, time, and situation, CNII-XII grossly intact, no focal deficits  Psych: Normal mood and affect   ASSESSMENT:   Lynn Newman is a 54 y.o. female who presents for the following: 1. Mixed hyperlipidemia   2. Essential hypertension     PLAN:   1. Mixed hyperlipidemia -Most recent LDL cholesterol is 200.  She has had elevated cholesterol her entire life.  She has several family members who have elevated cholesterol.  I highly suspect she has familial hypercholesterolemia.  She is never had a cardiac event.  We will increase her Crestor to 40 mg daily.  We will plan to recheck her lipid profile in 6 weeks.  I will go ahead and recheck a profile today.  She is fasting.  We do need to  confirm what was found to her primary care physician office. -We did discuss genetic testing.  I would recommend this.  She will think about it.  She does have 2 children and her brother.  I have recommended that the children be screened with lipid profiles regardless.  If we cannot get her cholesterol under control and statin and nonstatin agents, she will need to be considered for PCSK9 inhibitor therapy.  This may be easier with genetic testing.  She will think about it when I see her back we will discuss further.  2. Essential hypertension -Well-controlled on current medication.  No change.   Disposition: Return in about 6 weeks (around 04/01/2020).  Medication Adjustments/Labs and Tests Ordered: Current medicines are reviewed at length with the patient today.  Concerns regarding medicines are outlined above.  Orders Placed This Encounter  Procedures  . Lipid panel  . LDL cholesterol, direct  . Lipid panel  . LDL cholesterol, direct  . EKG 12-Lead   No orders of the defined types were placed in this encounter.   Patient Instructions  Medication Instructions:  The current medical regimen is effective;  continue present plan and medications.  *If you need a refill on your cardiac medications before your next appointment, please call your pharmacy*   Lab Work: LIPID- direct LDL today  LIPID-direct LDL one week before follow up appointment  If you have labs (blood work) drawn today and your tests are completely normal, you will receive your results only by: 04/03/2020 MyChart Message (if you have MyChart) OR . A paper copy in the mail If you have any lab test that is abnormal or we need to change your treatment, we will call you to review the results.   Follow-Up: At [facility], you and your health needs are our priority.  As part of our continuing mission to provide you with exceptional heart care, we have  created designated Provider Care Teams.  These Care Teams include your  primary Cardiologist (physician) and Advanced Practice Providers (APPs -  Physician Assistants and Nurse Practitioners) who all work together to provide you with the care you need, when you need it.  We recommend signing up for the patient portal called "MyChart".  Sign up information is provided on this After Visit Summary.  MyChart is used to connect with patients for Virtual Visits (Telemedicine).  Patients are able to view lab/test results, encounter notes, upcoming appointments, etc.  Non-urgent messages can be sent to your provider as well.   To learn more about what you can do with MyChart, go to [email or web address].    Your next appointment:   6 week(s)  The format for your next appointment:   In Person  Provider:   Lennie Odor, MD        Signed, Lenna Gilford. Flora Lipps, MD [facility]  [address] [place], [place] [address] [phone]  02/19/2020 10:13 AM

## 2020-02-19 ENCOUNTER — Encounter: Payer: Self-pay | Admitting: Cardiovascular Disease

## 2020-02-19 ENCOUNTER — Other Ambulatory Visit: Payer: Self-pay

## 2020-02-19 ENCOUNTER — Ambulatory Visit: Payer: 59 | Admitting: Cardiovascular Disease

## 2020-02-19 VITALS — BP 122/69 | HR 64 | Temp 96.4°F | Ht 63.0 in | Wt 209.0 lb

## 2020-02-19 DIAGNOSIS — I10 Essential (primary) hypertension: Secondary | ICD-10-CM | POA: Diagnosis not present

## 2020-02-19 DIAGNOSIS — E782 Mixed hyperlipidemia: Secondary | ICD-10-CM | POA: Diagnosis not present

## 2020-02-19 LAB — LIPID PANEL
Chol/HDL Ratio: 2.7 ratio (ref 0.0–4.4)
Cholesterol, Total: 153 mg/dL (ref 100–199)
HDL: 57 mg/dL (ref >39)
LDL Chol Calc (NIH): 77 mg/dL (ref 0–99)
Triglycerides: 108 mg/dL (ref 0–149)
VLDL Cholesterol Cal: 19 mg/dL (ref 5–40)

## 2020-02-19 LAB — LDL CHOLESTEROL, DIRECT: LDL Direct: 74 mg/dL (ref 0–99)

## 2020-02-19 NOTE — Patient Instructions (Signed)
Medication Instructions:  The current medical regimen is effective;  continue present plan and medications.  *If you need a refill on your cardiac medications before your next appointment, please call your pharmacy*   Lab Work: LIPID- direct LDL today  LIPID-direct LDL one week before follow up appointment  If you have labs (blood work) drawn today and your tests are completely normal, you will receive your results only by: Marland Kitchen MyChart Message (if you have MyChart) OR . A paper copy in the mail If you have any lab test that is abnormal or we need to change your treatment, we will call you to review the results.   Follow-Up: At [facility], you and your health needs are our priority.  As part of our continuing mission to provide you with exceptional heart care, we have created designated Provider Care Teams.  These Care Teams include your primary Cardiologist (physician) and Advanced Practice Providers (APPs -  Physician Assistants and Nurse Practitioners) who all work together to provide you with the care you need, when you need it.  We recommend signing up for the patient portal called "MyChart".  Sign up information is provided on this After Visit Summary.  MyChart is used to connect with patients for Virtual Visits (Telemedicine).  Patients are able to view lab/test results, encounter notes, upcoming appointments, etc.  Non-urgent messages can be sent to your provider as well.   To learn more about what you can do with MyChart, go to [email or web address].    Your next appointment:   6 week(s)  The format for your next appointment:   In Person  Provider:   Lennie Odor, MD

## 2020-02-21 ENCOUNTER — Other Ambulatory Visit: Payer: Self-pay | Admitting: Cardiovascular Disease

## 2020-02-21 MED ORDER — CRESTOR 10 MG PO TABS: 10.0000 mg | ORAL_TABLET | Freq: Every day | ORAL | 1 refills | Status: DC

## 2020-02-21 NOTE — Addendum Note (Signed)
Addended by: Tacy Dura on: 02/21/2020 10:18 AM   Modules accepted: Orders

## 2020-02-21 NOTE — Telephone Encounter (Signed)
*  STAT* If patient is at the pharmacy, call can be transferred to refill team.   1. Which medications need to be refilled? (please list name of each medication and dose if known)  Crestor 40 MG   2. Which pharmacy/location (including street and city if local pharmacy) is medication to be sent to? [id number] - [place], [place] - [address]  3. Do they need a 30 day or 90 day supply? 30 day supply

## 2020-02-22 ENCOUNTER — Other Ambulatory Visit: Payer: Self-pay

## 2020-02-22 ENCOUNTER — Telehealth: Payer: Self-pay | Admitting: Cardiovascular Disease

## 2020-02-22 MED ORDER — ROSUVASTATIN CALCIUM 40 MG PO TABS: 40.0000 mg | ORAL_TABLET | Freq: Every day | ORAL | 3 refills | Status: DC

## 2020-02-22 MED ORDER — ROSUVASTATIN CALCIUM 10 MG PO TABS: 40.0000 mg | ORAL_TABLET | Freq: Every day | ORAL | 6 refills | Status: DC

## 2020-02-22 MED ORDER — ROSUVASTATIN CALCIUM 10 MG PO TABS: 10.0000 mg | ORAL_TABLET | Freq: Every day | ORAL | 6 refills | Status: DC

## 2020-02-22 NOTE — Telephone Encounter (Signed)
Attempted to call Mrs. Tuner. Repeat lipids in our office show LDL is 74. She was sent to [place] for LDL above 200 at her PCP office. Unclear if her result earlier was lab error. She can go back to crestor 10 mg QD. We will still plan to check in a few weeks to make sure we are not in the error. I will see her back in 6 weeks.   Gerri Spore T. Flora Lipps, MD [facility]  [address], [address] [place], [place] [address] [phone]  10:45 AM

## 2020-02-22 NOTE — Telephone Encounter (Signed)
Called and spoke with pt, reviewed lab results per Dr. Flora Lipps. Notified to start her Crestor 10mg  again and she will have repeat blood work prior to her OV with Dr.O'Neal. no other questions at this time.

## 2020-02-22 NOTE — Telephone Encounter (Signed)
Patient returned call

## 2020-02-22 NOTE — Addendum Note (Signed)
Addended by: Alyson Ingles on: 02/22/2020 11:55 AM   Modules accepted: Orders

## 2020-02-22 NOTE — Telephone Encounter (Signed)
Returned call to pt notified of lab results per Dr Carmon Ginsberg. She will restart Crestor 10mg  and have blood work before appt for review then.

## 2020-02-22 NOTE — Telephone Encounter (Signed)
Pt c/o medication issue:  1. Name of Medication: Crestor 40 MG  2. How are you currently taking this medication (dosage and times per day)? 1 tablet (40 mg total) by mouth daily  3. Are you having a reaction (difficulty breathing--STAT)? No  4. What is your medication issue? Patient is requesting to have medication dosage increased from 10 MG to 40 MG. She states this change was discuss with Dr. Flora Lipps during appointment completed on 02/19/20. However, prescription for CRESTOR 10 MG tablet was called in to the pharmacy. She states she would like a 30 day supply sent to [id number] - [place], [place] - [address]

## 2020-02-22 NOTE — Addendum Note (Signed)
Addended by: Alyson Ingles on: 02/22/2020 11:45 AM   Modules accepted: Orders

## 2020-02-22 NOTE — Telephone Encounter (Signed)
Per Dr. Marylene Buerger note on 02/19/20-  PLAN:   1. Mixed hyperlipidemia -Most recent LDL cholesterol is 200.  She has had elevated cholesterol her entire life.  She has several family members who have elevated cholesterol.  I highly suspect she has familial hypercholesterolemia.  She is never had a cardiac event.  We will increase her Crestor to 40 mg daily.    Called and spoke with pt, notified that Dr.O'Neal did increase her Crestor at her last OV and that we would send in a new prescription. Pt verbalized understanding with no other questions at this time.

## 2020-04-04 ENCOUNTER — Ambulatory Visit: Payer: 59 | Admitting: Cardiology

## 2020-04-23 ENCOUNTER — Other Ambulatory Visit: Payer: Self-pay

## 2020-04-23 ENCOUNTER — Ambulatory Visit
Admission: RE | Admit: 2020-04-23 | Discharge: 2020-04-23 | Disposition: A | Discharge status: Home / Self Care | Payer: 59 | Source: Ambulatory Visit | Attending: Family | Admitting: Family

## 2020-04-23 DIAGNOSIS — Z1231 Encounter for screening mammogram for malignant neoplasm of breast: Secondary | ICD-10-CM

## 2020-05-19 NOTE — Progress Notes (Deleted)
Cardiology Office Note:   Date:  05/19/2020  NAME:  Lynn Newman    MRN: [id number] DOB:  1966-04-27   PCP:  Dema Severin, NP  Cardiologist:  No primary care provider on file.  Electrophysiologist:  None   Referring MD: Dema Severin, NP   No chief complaint on file. ***  History of Present Illness:   Lynn Newman is a 54 y.o. female with a hx of HLD, HTN who presents for follow-up. Was referred for elevated cholesterol. Repeat showed LDL 74.   Problem list 1.  Hyperthyroidism -On methimazole 2.  Rheumatoid arthritis 3.  Hyperlipidemia  -T chol 153, HDL 57, LDL 74, TG 108 -A1c 5.5 4.  Hypertension  Past Medical History: Past Medical History:  Diagnosis Date  . Anxiety   . Arthritis   . Arthritis   . Asthma   . Depression   . GERD (gastroesophageal reflux disease)   . Headache    migraines  . Hypertension   . Hyperthyroidism   . Hypothyroidism     Past Surgical History: Past Surgical History:  Procedure Laterality Date  . ABDOMINAL HYSTERECTOMY     partial  . CESAREAN SECTION    . CHOLECYSTECTOMY    . TONSILLECTOMY    . TOTAL HIP ARTHROPLASTY Right 08/11/2017   Procedure: RIGHT TOTAL HIP ARTHROPLASTY ANTERIOR APPROACH;  Surgeon: Samson Frederic, MD;  Location: [facility];  Service: Orthopedics;  Laterality: Right;  Needs RNFA  . VARICOSE VEIN SURGERY    . WRIST SURGERY     Left    Current Medications: No outpatient medications have been marked as taking for the 05/22/20 encounter (Appointment) with O'Neal, Ronnald Ramp, MD.     Allergies:    Sulfonamide derivatives   Social History: Social History   Socioeconomic History  . Marital status: Married    Spouse name: Not on file  . Number of children: Not on file  . Years of education: Not on file  . Highest education level: Not on file  Occupational History  . Occupation: daycare  Tobacco Use  . Smoking status: Current Every Day Smoker    Years: 10.00    Last attempt to quit:  08/06/2015    Years since quitting: 4.7  . Smokeless tobacco: Never Used  Vaping Use  . Vaping Use: Never used  Substance and Sexual Activity  . Alcohol use: No  . Drug use: No  . Sexual activity: Yes  Other Topics Concern  . Not on file  Social History Narrative  . Not on file   Social Determinants of Health   Financial Resource Strain:   . Difficulty of Paying Living Expenses: Not on file  Food Insecurity:   . Worried About Programme researcher, broadcasting/film/video in the Last Year: Not on file  . Ran Out of Food in the Last Year: Not on file  Transportation Needs:   . Lack of Transportation (Medical): Not on file  . Lack of Transportation (Non-Medical): Not on file  Physical Activity:   . Days of Exercise per Week: Not on file  . Minutes of Exercise per Session: Not on file  Stress:   . Feeling of Stress : Not on file  Social Connections:   . Frequency of Communication with Friends and Family: Not on file  . Frequency of Social Gatherings with Friends and Family: Not on file  . Attends Religious Services: Not on file  . Active Member of Clubs or Organizations: Not on file  .  Attends Banker Meetings: Not on file  . Marital Status: Not on file     Family History: The patient's ***family history includes Heart attack in her maternal grandfather and paternal grandfather; Heart attack (age of onset: 89) in her father; Hyperlipidemia in her brother; Hypertension in her mother. There is no history of Breast cancer.  ROS:   All other ROS reviewed and negative. Pertinent positives noted in the HPI.     EKGs/Labs/Other Studies Reviewed:   The following studies were personally reviewed by me today:  EKG:  EKG is *** ordered today.  The ekg ordered today demonstrates ***, and was personally reviewed by me.   Recent Labs: No results found for requested labs within last 8760 hours.   Recent Lipid Panel    Component Value Date/Time   CHOL 153 02/19/2020 1015   TRIG 108 02/19/2020  1015   HDL 57 02/19/2020 1015   CHOLHDL 2.7 02/19/2020 1015   LDLCALC 77 02/19/2020 1015   LDLDIRECT 74 02/19/2020 1015    Physical Exam:   VS:  There were no vitals taken for this visit.   Wt Readings from Last 3 Encounters:  02/19/20 209 lb (94.8 kg)  08/11/17 208 lb (94.3 kg)  08/05/17 208 lb (94.3 kg)    General: Well nourished, well developed, in no acute distress Heart: Atraumatic, normal size  Eyes: PEERLA, EOMI  Neck: Supple, no JVD Endocrine: No thryomegaly Cardiac: Normal S1, S2; RRR; no murmurs, rubs, or gallops Lungs: Clear to auscultation bilaterally, no wheezing, rhonchi or rales  Abd: Soft, nontender, no hepatomegaly  Ext: No edema, pulses 2+ Musculoskeletal: No deformities, BUE and BLE strength normal and equal Skin: Warm and dry, no rashes   Neuro: Alert and oriented to person, place, time, and situation, CNII-XII grossly intact, no focal deficits  Psych: Normal mood and affect   ASSESSMENT:   Lynn Newman is a 54 y.o. female who presents for the following: No diagnosis found.  PLAN:   There are no diagnoses linked to this encounter.  Disposition: No follow-ups on file.  Medication Adjustments/Labs and Tests Ordered: Current medicines are reviewed at length with the patient today.  Concerns regarding medicines are outlined above.  No orders of the defined types were placed in this encounter.  No orders of the defined types were placed in this encounter.   There are no Patient Instructions on file for this visit.   Time Spent with Patient: I have spent a total of *** minutes with patient reviewing hospital notes, telemetry, EKGs, labs and examining the patient as well as establishing an assessment and plan that was discussed with the patient.  > 50% of time was spent in direct patient care.  Signed, Lenna Gilford. Flora Lipps, MD [facility]  [address], [address] [place], [place] [address] [phone]  05/19/2020 6:49 PM

## 2020-05-22 ENCOUNTER — Ambulatory Visit: Payer: 59 | Admitting: Cardiovascular Disease

## 2020-05-22 DIAGNOSIS — E782 Mixed hyperlipidemia: Secondary | ICD-10-CM

## 2020-05-22 DIAGNOSIS — I10 Essential (primary) hypertension: Secondary | ICD-10-CM

## 2020-10-23 ENCOUNTER — Ambulatory Visit
Admission: EM | Admit: 2020-10-23 | Discharge: 2020-10-23 | Disposition: A | Discharge status: Home / Self Care | Payer: 59 | Attending: Emergency Medicine | Admitting: Emergency Medicine

## 2020-10-23 ENCOUNTER — Other Ambulatory Visit: Payer: Self-pay

## 2020-10-23 DIAGNOSIS — H60502 Unspecified acute noninfective otitis externa, left ear: Secondary | ICD-10-CM | POA: Diagnosis not present

## 2020-10-23 MED ORDER — CEPHALEXIN 500 MG PO CAPS: 500.0000 mg | ORAL_CAPSULE | Freq: Four times a day (QID) | ORAL | 0 refills | Status: AC

## 2020-10-23 MED ORDER — NEOMYCIN-POLYMYXIN-HC 3.5-10000-1 OT SUSP: 4.0000 [drp] | Freq: Three times a day (TID) | OTIC | 0 refills | Status: DC

## 2020-10-23 NOTE — ED Triage Notes (Signed)
Pt presents with left side ear swelling and pain for past couple days

## 2020-10-23 NOTE — ED Provider Notes (Signed)
[facility]    CSN: [id number] Arrival date & time: 10/23/20  1059      History   Chief Complaint Chief Complaint  Patient presents with  . Otalgia    HPI Lynn Newman is a 55 y.o. female presenting today for evaluation of ear pain.  Reports left-sided ear pain and swelling for approximately 2 to 3 days.  Decreased hearing slightly off balance.  Denies associated URI symptoms.  HPI  Past Medical History:  Diagnosis Date  . Anxiety   . Arthritis   . Arthritis   . Asthma   . Depression   . GERD (gastroesophageal reflux disease)   . Headache    migraines  . Hypertension   . Hyperthyroidism   . Hypothyroidism     Patient Active Problem List   Diagnosis Date Noted  . Avascular necrosis of hip, right (HCC) 08/11/2017  . Avascular necrosis of bone of hip, right (HCC) 08/11/2017  . ABDOMINAL PAIN OTHER SPECIFIED SITE 03/07/2009    Past Surgical History:  Procedure Laterality Date  . ABDOMINAL HYSTERECTOMY     partial  . CESAREAN SECTION    . CHOLECYSTECTOMY    . TONSILLECTOMY    . TOTAL HIP ARTHROPLASTY Right 08/11/2017   Procedure: RIGHT TOTAL HIP ARTHROPLASTY ANTERIOR APPROACH;  Surgeon: Samson Frederic, MD;  Location: [facility];  Service: Orthopedics;  Laterality: Right;  Needs RNFA  . VARICOSE VEIN SURGERY    . WRIST SURGERY     Left    OB History   No obstetric history on file.      Home Medications    Prior to Admission medications   Medication Sig Start Date End Date Taking? Authorizing Provider  cephALEXin (KEFLEX) 500 MG capsule Take 1 capsule (500 mg total) by mouth 4 (four) times daily for 5 days. 10/23/20 10/28/20 Yes Dene Landsberg C, PA-C  neomycin-polymyxin-hydrocortisone (CORTISPORIN) 3.5-10000-1 OTIC suspension Place 4 drops into the left ear 3 (three) times daily. 10/23/20  Yes Mame Twombly C, PA-C  diclofenac (VOLTAREN) 75 MG EC tablet Take 75 mg by mouth 2 (two) times daily. 06/27/17   [provider]   Enalapril-Hydrochlorothiazide 5-12.5 MG tablet Take 1 tablet by mouth daily. 07/15/17   [provider]  HYDROcodone-acetaminophen (NORCO/VICODIN) 5-325 MG tablet Take 1-2 tablets by mouth every 4 (four) hours as needed (hip pain). 08/12/17   Swinteck, Arlys John, MD  methimazole (TAPAZOLE) 5 MG tablet Take 5 mg by mouth daily. 06/20/17   [provider]  omeprazole (PRILOSEC) 40 MG capsule Take 40 mg by mouth 2 (two) times daily. 06/20/17   [provider]  rosuvastatin (CRESTOR) 10 MG tablet Take 1 tablet (10 mg total) by mouth daily. 02/22/20   Sande Rives, MD  venlafaxine XR (EFFEXOR-XR) 75 MG 24 hr capsule Take 75 mg by mouth daily. 06/20/17   [provider]    Family History Family History  Problem Relation Age of Onset  . Hypertension Mother   . Heart attack Father 75  . Hyperlipidemia Brother   . Heart attack Maternal Grandfather   . Heart attack Paternal Grandfather   . Breast cancer Neg Hx     Social History Social History   Tobacco Use  . Smoking status: Current Every Day Smoker    Years: 10.00    Last attempt to quit: 08/06/2015    Years since quitting: 5.2  . Smokeless tobacco: Never Used  Vaping Use  . Vaping Use: Never used  Substance Use  Topics  . Alcohol use: No  . Drug use: No     Allergies   Sulfonamide derivatives   Review of Systems Review of Systems  Constitutional: Negative for activity change, appetite change, chills, fatigue and fever.  HENT: Positive for ear pain and facial swelling. Negative for congestion, rhinorrhea, sinus pressure, sore throat and trouble swallowing.   Eyes: Negative for discharge and redness.  Respiratory: Negative for cough, chest tightness and shortness of breath.   Cardiovascular: Negative for chest pain.  Gastrointestinal: Negative for abdominal pain, diarrhea, nausea and vomiting.  Musculoskeletal: Negative for myalgias.  Skin: Negative for rash.  Neurological: Negative for  dizziness, light-headedness and headaches.     Physical Exam Triage Vital Signs ED Triage Vitals [10/23/20 1141]  Enc Vitals Group     BP 121/80     Pulse Rate 67     Resp 20     Temp 98 F (36.7 C)     Temp src      SpO2 98 %     Weight      Height      Head Circumference      Peak Flow      Pain Score      Pain Loc      Pain Edu?      Excl. in GC?    No data found.  Updated Vital Signs BP 121/80   Pulse 67   Temp 98 F (36.7 C)   Resp 20   SpO2 98%   Visual Acuity Right Eye Distance:   Left Eye Distance:   Bilateral Distance:    Right Eye Near:   Left Eye Near:    Bilateral Near:     Physical Exam Vitals and nursing note reviewed.  Constitutional:      Appearance: She is well-developed.     Comments: No acute distress  HENT:     Head: Normocephalic and atraumatic.     Ears:     Comments: External ear with swelling noted just outside of canal opening and lateral portion of canal, medial canal without swelling or erythema, TM intact with good bony landmarks and cone of light    Nose: Nose normal.  Eyes:     Conjunctiva/sclera: Conjunctivae normal.  Cardiovascular:     Rate and Rhythm: Normal rate.  Pulmonary:     Effort: Pulmonary effort is normal. No respiratory distress.  Abdominal:     General: There is no distension.  Musculoskeletal:        General: Normal range of motion.     Cervical back: Neck supple.  Skin:    General: Skin is warm and dry.  Neurological:     Mental Status: She is alert and oriented to person, place, and time.      UC Treatments / Results  Labs (all labs ordered are listed, but only abnormal results are displayed) Labs Reviewed - No data to display  EKG   Radiology No results found.  Procedures Procedures (including critical care time)  Medications Ordered in UC Medications - No data to display  Initial Impression / Assessment and Plan / UC Course  I have reviewed the triage vital signs and the nursing  notes.  Pertinent labs & imaging results that were available during my care of the patient were reviewed by me and considered in my medical decision making (see chart for details).     Appears to have otitis externa-TM intact and without any sign of otitis media.  Treating with Cortisporin drops anti-inflammatories and close monitoring.  Provided Keflex to add in if not seeing any improvement within 2 to 3 days, given external swelling to treat for cellulitis.  Continue to monitor,Discussed strict return precautions. Patient verbalized understanding and is agreeable with plan.  Final Clinical Impressions(s) / UC Diagnoses   Final diagnoses:  Acute otitis externa of left ear, unspecified type     Discharge Instructions     Please use Cortisporin eardrops 4 times daily for the next week Keep ear clean and dry Tylenol and ibuprofen for pain If not seeing any improvement within 48-72 hours please start Keflex Follow-up for any concerns   ED Prescriptions    Medication Sig Dispense Auth. Provider   neomycin-polymyxin-hydrocortisone (CORTISPORIN) 3.5-10000-1 OTIC suspension Place 4 drops into the left ear 3 (three) times daily. 10 mL Lynn Newman C, PA-C   cephALEXin (KEFLEX) 500 MG capsule Take 1 capsule (500 mg total) by mouth 4 (four) times daily for 5 days. 20 capsule Lynn Newman, Laton C, PA-C     PDMP not reviewed this encounter.   Lew Dawes, PA-C 10/23/20 1235

## 2020-10-23 NOTE — Discharge Instructions (Signed)
Please use Cortisporin eardrops 4 times daily for the next week Keep ear clean and dry Tylenol and ibuprofen for pain If not seeing any improvement within 48-72 hours please start Keflex Follow-up for any concerns

## 2020-10-24 ENCOUNTER — Telehealth: Payer: Self-pay

## 2020-10-24 NOTE — Telephone Encounter (Signed)
Pt called concerning her facial swelling. Pt was advise to continue treatment with the medication she was prescribed and if the symptoms has not gotten better then she should come back to clinic to be reevaluated.

## 2020-10-28 ENCOUNTER — Other Ambulatory Visit: Payer: Self-pay | Admitting: Cardiovascular Disease

## 2020-10-31 ENCOUNTER — Emergency Department (HOSPITAL_BASED_OUTPATIENT_CLINIC_OR_DEPARTMENT_OTHER)
Admission: EM | Admit: 2020-10-31 | Discharge: 2020-10-31 | Disposition: A | Discharge status: Home / Self Care | Payer: 59 | Attending: Emergency Medicine | Admitting: Emergency Medicine

## 2020-10-31 ENCOUNTER — Other Ambulatory Visit: Payer: Self-pay

## 2020-10-31 ENCOUNTER — Emergency Department (HOSPITAL_BASED_OUTPATIENT_CLINIC_OR_DEPARTMENT_OTHER): Payer: 59

## 2020-10-31 ENCOUNTER — Encounter (HOSPITAL_BASED_OUTPATIENT_CLINIC_OR_DEPARTMENT_OTHER): Payer: Self-pay | Admitting: *Deleted

## 2020-10-31 DIAGNOSIS — G510 Bell's palsy: Secondary | ICD-10-CM

## 2020-10-31 DIAGNOSIS — R202 Paresthesia of skin: Secondary | ICD-10-CM

## 2020-10-31 DIAGNOSIS — E039 Hypothyroidism, unspecified: Secondary | ICD-10-CM | POA: Insufficient documentation

## 2020-10-31 DIAGNOSIS — J45909 Unspecified asthma, uncomplicated: Secondary | ICD-10-CM | POA: Diagnosis not present

## 2020-10-31 DIAGNOSIS — F17210 Nicotine dependence, cigarettes, uncomplicated: Secondary | ICD-10-CM | POA: Insufficient documentation

## 2020-10-31 DIAGNOSIS — I10 Essential (primary) hypertension: Secondary | ICD-10-CM | POA: Diagnosis not present

## 2020-10-31 DIAGNOSIS — Z79899 Other long term (current) drug therapy: Secondary | ICD-10-CM | POA: Insufficient documentation

## 2020-10-31 MED ORDER — VALACYCLOVIR HCL 1 G PO TABS: 1000.0000 mg | ORAL_TABLET | Freq: Three times a day (TID) | ORAL | 0 refills | Status: AC

## 2020-10-31 MED ORDER — PREDNISONE 20 MG PO TABS: 60.0000 mg | ORAL_TABLET | Freq: Every day | ORAL | 0 refills | Status: AC

## 2020-10-31 NOTE — ED Triage Notes (Signed)
Twitching on the left side of her face for the past 3 days. She was seen at [facility] today and told she may have Bells Palsy but advised her to come to the ER.

## 2020-10-31 NOTE — ED Provider Notes (Signed)
[facility] EMERGENCY DEPARTMENT Provider Note   CSN: [id number] Arrival date & time: 10/31/20  1856     History No chief complaint on file.   Lynn Newman is a 55 y.o. female with no relevant past medical history presents the ED sent from urgent care with a 4-day history of left-sided facial parasthesias.  I reviewed patient's medical record and she was seen at an urgent care on 10/23/2020 and diagnosed with acute otitis externa involving left ear and discharged home with appropriate otic antibiotics and Keflex to cover for possible skin/soft tissue infection.  At that time, she presented for atraumatic left ear swelling.  She is also followed by ENT for eustachian tube dysfunction and is s/p tube placement right TM.  On my examination, patient reports that after she was treated with antibiotics at the urgent care last week, her left ear discomfort and discomfort improved.  However, she has since experienced facial swelling and left-sided neck swelling that she reports has already improved spontaneously.  However, she has persistent and ongoing paresthesias and tingling sensation face and most notably left infraorbital area.  She also continues to endorse diminished sensation involving her left earlobe.  She reports that she was sent here for a head CT, but she does not know why.  She mentioned that they were concerned for possible stroke.  She has a questionable asymmetric smile, but husband at bedside states that it is her baseline.  She does have intermittent headaches, predominantly on left side, but reports that she has a history of sinus congestion headaches which is one of the reasons why she has been requested by ENT.    She denies any fevers or chills, meningismus, facial pain, extremity weakness or numbness, dysarthria, ataxia, blurred vision, drooling, or other symptoms.  HPI     Past Medical History:  Diagnosis Date  . Anxiety   . Arthritis   . Arthritis   .  Asthma   . Depression   . GERD (gastroesophageal reflux disease)   . Headache    migraines  . Hypertension   . Hyperthyroidism   . Hypothyroidism     Patient Active Problem List   Diagnosis Date Noted  . Avascular necrosis of hip, right (HCC) 08/11/2017  . Avascular necrosis of bone of hip, right (HCC) 08/11/2017  . ABDOMINAL PAIN OTHER SPECIFIED SITE 03/07/2009    Past Surgical History:  Procedure Laterality Date  . ABDOMINAL HYSTERECTOMY     partial  . CESAREAN SECTION    . CHOLECYSTECTOMY    . TONSILLECTOMY    . TOTAL HIP ARTHROPLASTY Right 08/11/2017   Procedure: RIGHT TOTAL HIP ARTHROPLASTY ANTERIOR APPROACH;  Surgeon: Samson Frederic, MD;  Location: [facility];  Service: Orthopedics;  Laterality: Right;  Needs RNFA  . VARICOSE VEIN SURGERY    . WRIST SURGERY     Left     OB History   No obstetric history on file.     Family History  Problem Relation Age of Onset  . Hypertension Mother   . Heart attack Father 53  . Hyperlipidemia Brother   . Heart attack Maternal Grandfather   . Heart attack Paternal Grandfather   . Breast cancer Neg Hx     Social History   Tobacco Use  . Smoking status: Current Every Day Smoker    Years: 10.00    Last attempt to quit: 08/06/2015    Years since quitting: 5.2  . Smokeless tobacco: Never Used  Vaping Use  .  Vaping Use: Never used  Substance Use Topics  . Alcohol use: No  . Drug use: No    Home Medications Prior to Admission medications   Medication Sig Start Date End Date Taking? Authorizing Provider  CRESTOR 10 MG tablet TAKE 1 TABLET BY MOUTH EVERY DAY 10/28/20  Yes O'Neal, Ronnald Ramp, MD  diclofenac (VOLTAREN) 75 MG EC tablet Take 75 mg by mouth 2 (two) times daily. 06/27/17  Yes [provider]  Enalapril-Hydrochlorothiazide 5-12.5 MG tablet Take 1 tablet by mouth daily. 07/15/17  Yes [provider]  methimazole (TAPAZOLE) 5 MG tablet Take 5 mg by mouth daily. 06/20/17  Yes [provider]  omeprazole (PRILOSEC) 40 MG capsule Take 40 mg by mouth 2 (two) times daily. 06/20/17  Yes [provider]  venlafaxine XR (EFFEXOR-XR) 75 MG 24 hr capsule Take 75 mg by mouth daily. 06/20/17  Yes [provider]  HYDROcodone-acetaminophen (NORCO/VICODIN) 5-325 MG tablet Take 1-2 tablets by mouth every 4 (four) hours as needed (hip pain). 08/12/17   Swinteck, Arlys John, MD  neomycin-polymyxin-hydrocortisone (CORTISPORIN) 3.5-10000-1 OTIC suspension Place 4 drops into the left ear 3 (three) times daily. 10/23/20   Wieters, Hallie C, PA-C    Allergies    Sulfonamide derivatives  Review of Systems   Review of Systems  All other systems reviewed and are negative.   Physical Exam Updated Vital Signs BP 128/87 (BP Location: Right Arm)   Pulse 71   Temp 98.3 F (36.8 C) (Oral)   Resp 16   Ht 5\' 3"  (1.6 m)   Wt 99.3 kg   SpO2 98%   BMI 38.79 kg/m   Physical Exam Vitals and nursing note reviewed. Exam conducted with a chaperone present.  Constitutional:      General: She is not in acute distress.    Appearance: Normal appearance. She is not ill-appearing.  HENT:     Head: Normocephalic and atraumatic.     Comments: No significant facial swelling or asymmetries noted.    Mouth/Throat:     Comments: Smiles relatively symmetrically (husband states that it's her normal) Eyes:     General: No scleral icterus.    Extraocular Movements: Extraocular movements intact.     Conjunctiva/sclera: Conjunctivae normal.     Pupils: Pupils are equal, round, and reactive to light.     Comments: No nystagmus.  Neck:     Comments: No significant adenopathy appreciated.  No meningismus.  No tenderness.  No overlying skin changes. Cardiovascular:     Rate and Rhythm: Normal rate.  Pulmonary:     Effort: Pulmonary effort is normal. No respiratory distress.  Musculoskeletal:     Cervical back: Normal range of motion and neck supple. No rigidity or tenderness.  Skin:    General:  Skin is dry.     Findings: No rash.  Neurological:     Mental Status: She is alert and oriented to person, place, and time.     GCS: GCS eye subscore is 4. GCS verbal subscore is 5. GCS motor subscore is 6.     Comments: Diminished sensation to light touch involving left earlobe, left maxillary region, and left side of neck. Moves all extremities with strength intact against resistance.  Sensation is otherwise intact and symmetric throughout.  PERRL and EOM intact.  Coordination intact.  Aside from diminished sensation to touch, no other focal deficits.  Psychiatric:        Mood and Affect: Mood normal.  Behavior: Behavior normal.        Thought Content: Thought content normal.     ED Results / Procedures / Treatments   Labs (all labs ordered are listed, but only abnormal results are displayed) Labs Reviewed - No data to display  EKG None  Radiology No results found.  Procedures Procedures   Medications Ordered in ED Medications - No data to display  ED Course  I have reviewed the triage vital signs and the nursing notes.  Pertinent labs & imaging results that were available during my care of the patient were reviewed by me and considered in my medical decision making (see chart for details).    MDM Rules/Calculators/A&P                          Sherlyn Lick Christain Mcraney was evaluated in Emergency Department on 10/31/2020 for the symptoms described in the history of present illness. She was evaluated in the context of the global COVID-19 pandemic, which necessitated consideration that the patient might be at risk for infection with the SARS-CoV-2 virus that causes COVID-19. Institutional protocols and algorithms that pertain to the evaluation of patients at risk for COVID-19 are in a state of rapid change based on information released by regulatory bodies including the CDC and federal and state organizations. These policies and algorithms were followed during the patient's care  in the ED.  I personally reviewed patient's medical chart and all notes from triage and staff during today's encounter. I have also ordered and reviewed all labs and imaging that I felt to be medically necessary in the evaluation of this patient's complaints and with consideration of their physical exam. If needed, translation services were available and utilized.   Patient with residual left-sided facial paresthesias that she thought perhaps was related to her maxillary region swelling that has since improved.  She has a history of sinusitis and is closely followed by ENT.  She was sent from urgent care  Discussed case with Dr. Lynelle Doctor who will assume care at time of shift change.  We will obtain CT head and maxillofacial without contrast for further evaluation.       Final Clinical Impression(s) / ED Diagnoses Final diagnoses:  Paresthesias    Rx / DC Orders ED Discharge Orders    None       Elvera Maria 10/31/20 Hart Robinsons, MD 10/31/20 2107

## 2021-05-12 ENCOUNTER — Other Ambulatory Visit: Payer: Self-pay | Admitting: Cardiovascular Disease

## 2021-06-10 ENCOUNTER — Other Ambulatory Visit: Payer: Self-pay | Admitting: Family

## 2021-06-10 DIAGNOSIS — Z1231 Encounter for screening mammogram for malignant neoplasm of breast: Secondary | ICD-10-CM

## 2021-07-15 ENCOUNTER — Ambulatory Visit
Admission: RE | Admit: 2021-07-15 | Discharge: 2021-07-15 | Disposition: A | Discharge status: Home / Self Care | Payer: 59 | Source: Ambulatory Visit | Attending: Family | Admitting: Family

## 2021-07-15 DIAGNOSIS — Z1231 Encounter for screening mammogram for malignant neoplasm of breast: Secondary | ICD-10-CM

## 2021-08-02 HISTORY — PX: CERVICAL SPINE SURGERY: SHX589

## 2022-05-11 IMAGING — MG MM DIGITAL SCREENING BILAT W/ TOMO AND CAD
8 series · 8 of 24 positions shown · non-contrast
Comparison: Previous exam(s).

CLINICAL DATA: Screening.

EXAM:
DIGITAL SCREENING BILATERAL MAMMOGRAM WITH TOMOSYNTHESIS AND CAD
TECHNIQUE: Bilateral screening digital craniocaudal and mediolateral oblique
mammograms were obtained. Bilateral screening digital breast
tomosynthesis was performed. The images were evaluated with
computer-aided detection.

[R MLO synth-2D]
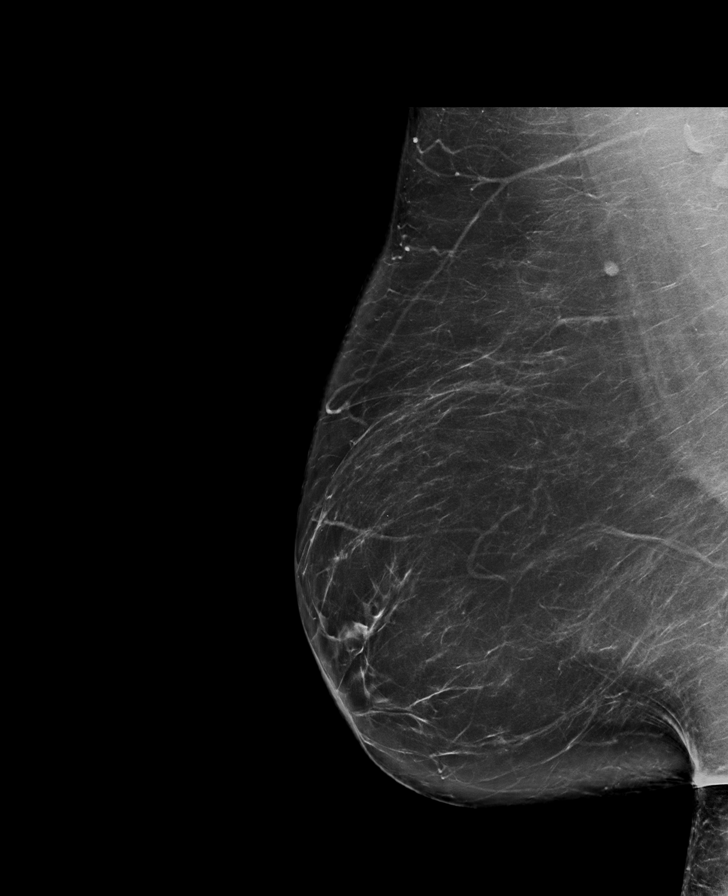

[R CC synth-2D]
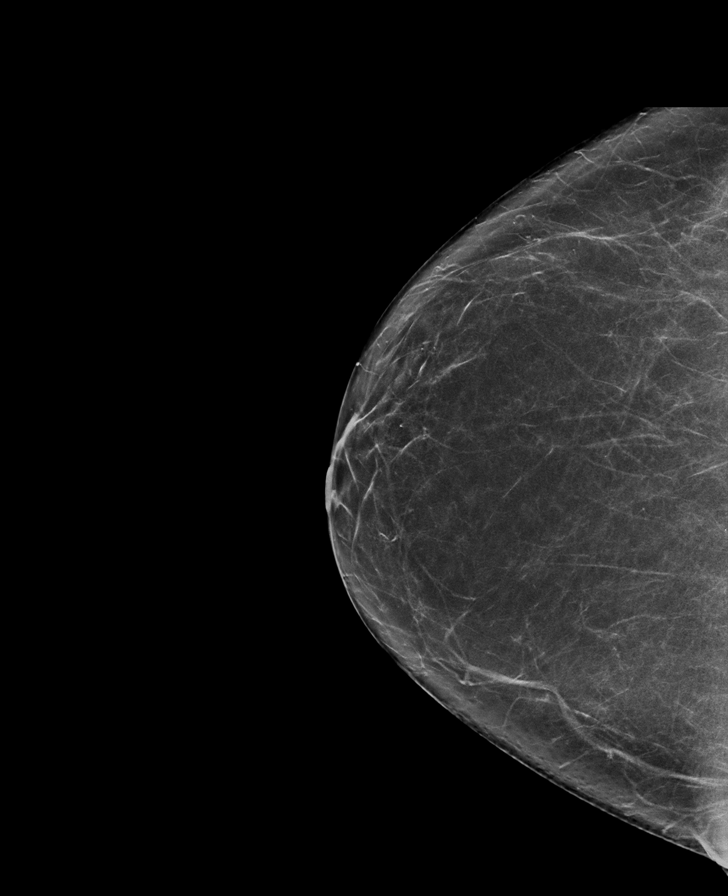

[L CC synth-2D]
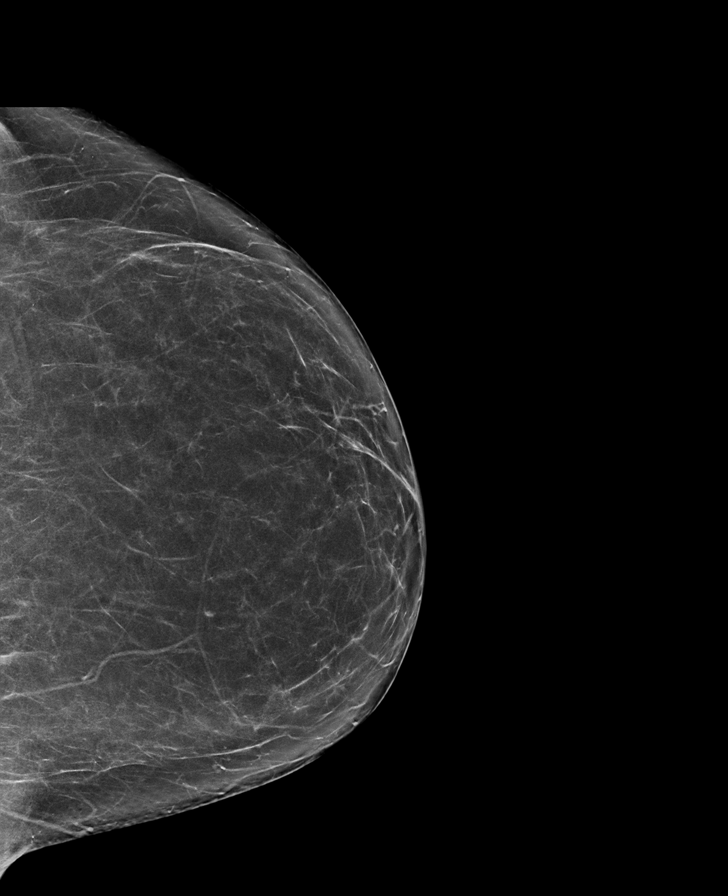

[L MLO synth-2D]
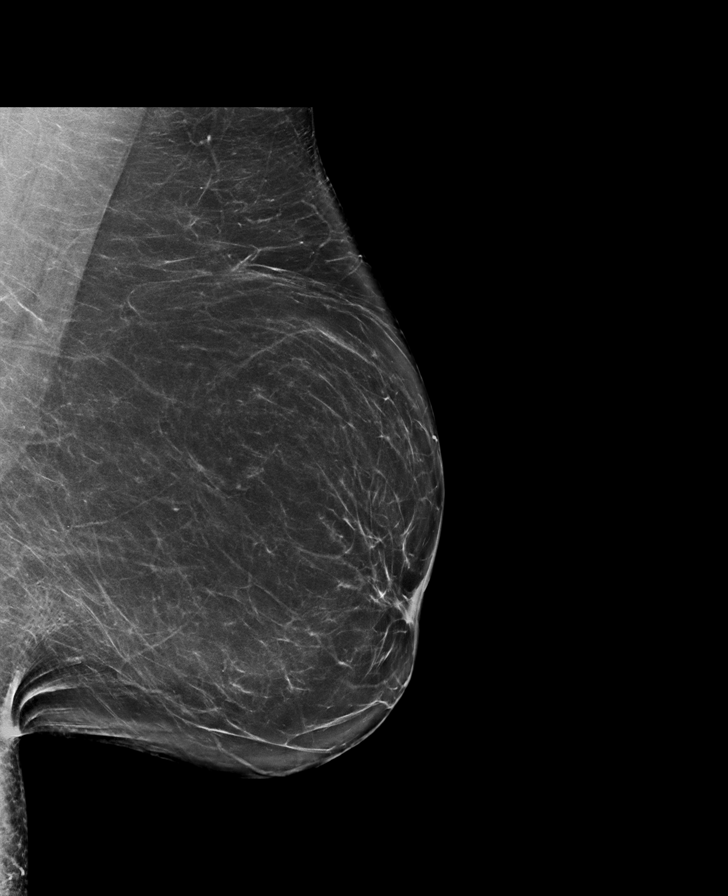

[R MLO tomo · tomo slice 47/92.0]
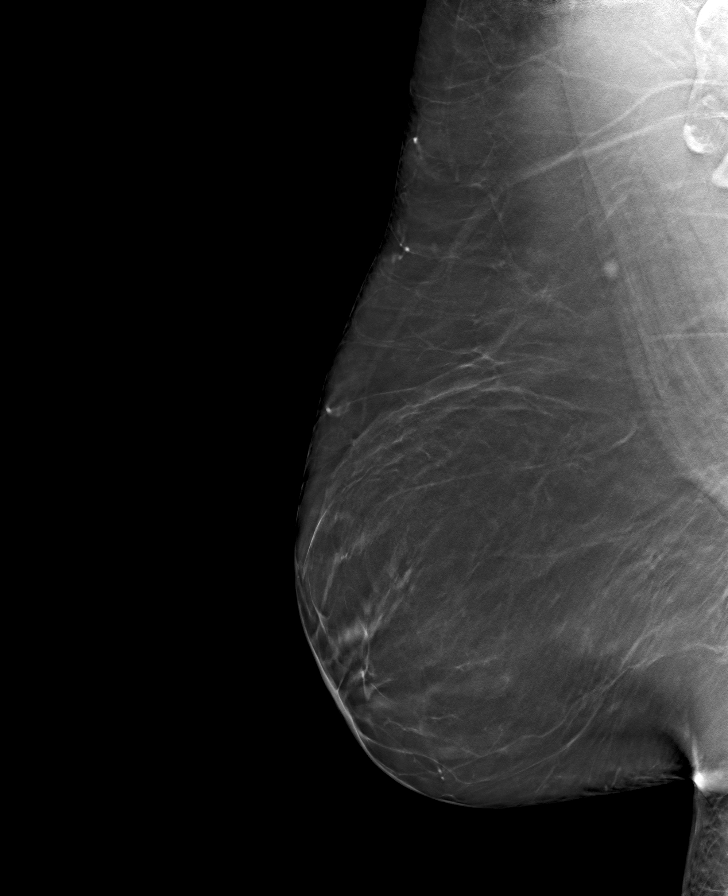

[L MLO tomo · tomo slice 44/87.0]
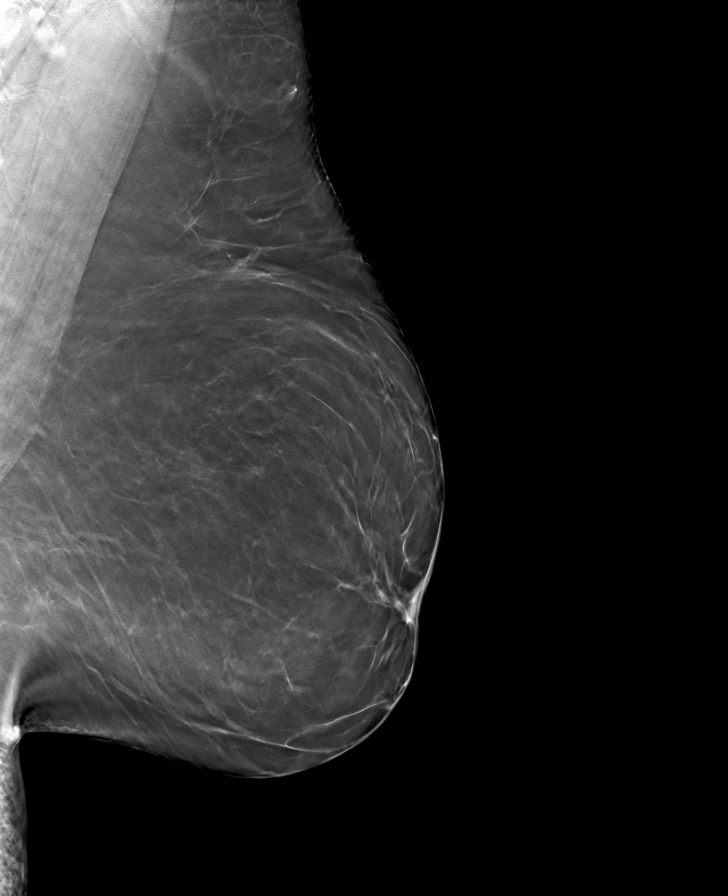

[L CC tomo · tomo slice 37/74.0]
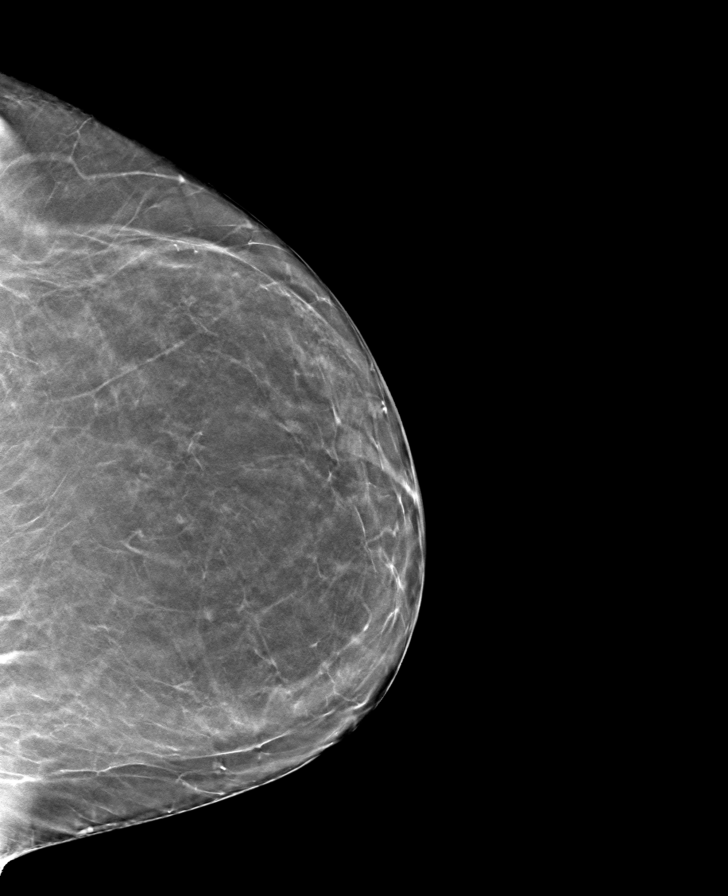

[R CC tomo · tomo slice 37/72.0]
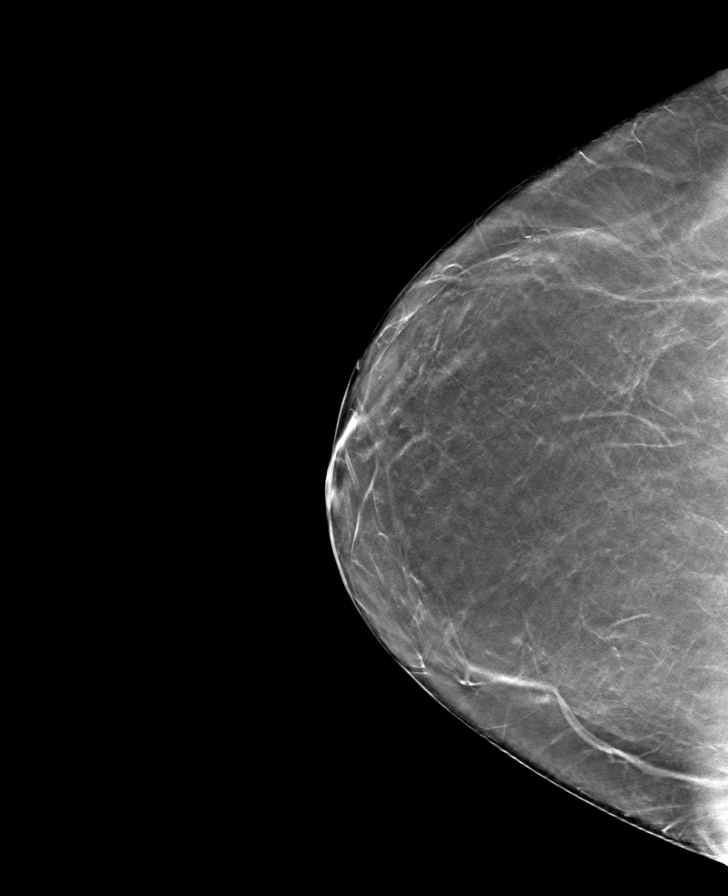

[8 of 24 positions shown; findings below may reference images not displayed]

ACR Breast Density Category b: There are scattered areas of
fibroglandular density.
FINDINGS: There are no findings suspicious for malignancy.
IMPRESSION: No mammographic evidence of malignancy. A result letter of this
screening mammogram will be mailed directly to the patient.

RECOMMENDATION:
Screening mammogram in one year. (Code:51-O-LD2)

BI-RADS CATEGORY  1: Negative.

## 2022-08-27 ENCOUNTER — Telehealth: Payer: Self-pay

## 2022-08-27 NOTE — Telephone Encounter (Signed)
Spoke with the patient regarding the referral to GYN oncology. Patient scheduled as new patient with Dr Ernestina Patches on 09/13/2022. Patient given an arrival time of 10:30am.  Explained to the patient the the doctor will perform a pelvic exam at this visit. Patient given the policy that no visitors under the 16 yrs are allowed in the [place]. Patient given the address/phone number for the clinic and that the center offers free valet service. Patient aware of the new mask mandate.

## 2022-09-08 ENCOUNTER — Encounter: Payer: Self-pay | Admitting: Psychiatry

## 2022-09-13 ENCOUNTER — Encounter: Payer: Self-pay | Admitting: Psychiatry

## 2022-09-13 ENCOUNTER — Inpatient Hospital Stay (HOSPITAL_BASED_OUTPATIENT_CLINIC_OR_DEPARTMENT_OTHER): Payer: 59 | Admitting: Gynecologic Oncology

## 2022-09-13 ENCOUNTER — Other Ambulatory Visit: Payer: Self-pay

## 2022-09-13 ENCOUNTER — Inpatient Hospital Stay: Payer: 59 | Attending: Psychiatry | Admitting: Psychiatry

## 2022-09-13 ENCOUNTER — Encounter: Payer: Self-pay | Admitting: Gynecologic Oncology

## 2022-09-13 VITALS — BP 143/70 | HR 85 | Temp 98.4°F | Resp 16 | Ht 63.0 in | Wt 228.0 lb

## 2022-09-13 DIAGNOSIS — M1990 Unspecified osteoarthritis, unspecified site: Secondary | ICD-10-CM | POA: Diagnosis not present

## 2022-09-13 DIAGNOSIS — R140 Abdominal distension (gaseous): Secondary | ICD-10-CM | POA: Insufficient documentation

## 2022-09-13 DIAGNOSIS — K219 Gastro-esophageal reflux disease without esophagitis: Secondary | ICD-10-CM | POA: Diagnosis not present

## 2022-09-13 DIAGNOSIS — Z791 Long term (current) use of non-steroidal anti-inflammatories (NSAID): Secondary | ICD-10-CM | POA: Insufficient documentation

## 2022-09-13 DIAGNOSIS — E0590 Thyrotoxicosis, unspecified without thyrotoxic crisis or storm: Secondary | ICD-10-CM | POA: Diagnosis not present

## 2022-09-13 DIAGNOSIS — F419 Anxiety disorder, unspecified: Secondary | ICD-10-CM | POA: Diagnosis not present

## 2022-09-13 DIAGNOSIS — F17210 Nicotine dependence, cigarettes, uncomplicated: Secondary | ICD-10-CM | POA: Insufficient documentation

## 2022-09-13 DIAGNOSIS — Z79899 Other long term (current) drug therapy: Secondary | ICD-10-CM | POA: Diagnosis not present

## 2022-09-13 DIAGNOSIS — I10 Essential (primary) hypertension: Secondary | ICD-10-CM | POA: Insufficient documentation

## 2022-09-13 DIAGNOSIS — R1900 Intra-abdominal and pelvic swelling, mass and lump, unspecified site: Secondary | ICD-10-CM | POA: Diagnosis not present

## 2022-09-13 DIAGNOSIS — R1031 Right lower quadrant pain: Secondary | ICD-10-CM | POA: Diagnosis not present

## 2022-09-13 DIAGNOSIS — J45909 Unspecified asthma, uncomplicated: Secondary | ICD-10-CM | POA: Diagnosis not present

## 2022-09-13 DIAGNOSIS — Z6841 Body Mass Index (BMI) 40.0 and over, adult: Secondary | ICD-10-CM | POA: Insufficient documentation

## 2022-09-13 DIAGNOSIS — F32A Depression, unspecified: Secondary | ICD-10-CM | POA: Diagnosis not present

## 2022-09-13 DIAGNOSIS — N838 Other noninflammatory disorders of ovary, fallopian tube and broad ligament: Secondary | ICD-10-CM

## 2022-09-13 DIAGNOSIS — Z90710 Acquired absence of both cervix and uterus: Secondary | ICD-10-CM | POA: Insufficient documentation

## 2022-09-13 MED ORDER — TRAMADOL HCL 50 MG PO TABS: 50.0000 mg | ORAL_TABLET | Freq: Four times a day (QID) | ORAL | 0 refills | Status: DC | PRN

## 2022-09-13 MED ORDER — SENNOSIDES-DOCUSATE SODIUM 8.6-50 MG PO TABS: 2.0000 | ORAL_TABLET | Freq: Every day | ORAL | 0 refills | Status: DC

## 2022-09-13 NOTE — H&P (View-Only) (Signed)
GYNECOLOGIC ONCOLOGY NEW PATIENT CONSULTATION  Date of Service: 09/13/2022 Referring Provider: Vania Rea, MD   ASSESSMENT AND PLAN: Lynn Newman is a 57 y.o. woman with 5.2cm heterogenous right ovary and RLQ pain.  We reviewed that the exact etiology of the pelvic mass is unclear, but could include a benign, borderline, or malignant process.  The recommended treatment is surgical excision to make a definitive diagnosis.  Ultrasound report reviewed. Images not available for reviewed.  Recommend preoperative CT abdomen/pelvis  Because the mass is relatively small, we feel that a minimally invasive approach is feasible, using robotic assistance.    In the event of malignancy or borderline tumor on frozen section, we will perform indicated staging procedures. We discussed that these procedures may include omentectomy pelvic and/or para-aortic lymphadenectomy, peritoneal biopsies. We would also remove any tissue concerning for metastatic disease which could require additional procedures including bowel surgery.  Patient was consented for: Robotic assisted laparoscopic bilateral salpingo-oophorectomy, possible staging with possible pelvic/periotic lymph node dissection, biopsies, omentectomy, mini laparotomy on 10/05/2022. We discussed that OR date of 2/27 may become available.  Patient would be interested in moving up if available.  The risks of surgery were discussed in detail and she understands these to including but not limited to bleeding requiring a blood transfusion, infection, injury to adjacent organs (including but not limited to the bowels, bladder, ureters, nerves, blood vessels), thromboembolic events, wound separation, hernia, possible risk of lymphedema and lymphocyst if lymphadenectomy performed, unforseen complication, and possible need for re-exploration.  If the patient experiences any of these events, she understands that her hospitalization or recovery may be prolonged and  that she may need to take additional medications for a prolonged period. The patient will receive DVT and antibiotic prophylaxis as indicated. She voiced a clear understanding. She had the opportunity to ask questions and informed consent was obtained today. She wishes to proceed.  She does not require preoperative clearance. Her METs are >4.  All preoperative instructions were reviewed. Postoperative expectations were also reviewed.    A copy of this note was sent to the patient's referring provider.  Bernadene Bell, MD Gynecologic Oncology   Medical Decision Making I personally spent  TOTAL 45 minutes face-to-face and non-face-to-face in the care of this patient, which includes all pre, intra, and post visit time on the date of service.   ------------  CC: Enlarged ovary  HISTORY OF PRESENT ILLNESS:  Lynn Newman is a 57 y.o. woman who is seen in consultation at the request of Vania Rea, MD for evaluation of an enlarged heterogeneous ovary and right lower quadrant pain.  Patient was seen in follow-up by her OB/GYN on 08/25/2022.  She had noted pain and bloating for about 2 months.  An ultrasound was done in November 2023 which showed a right ovary measuring 4.6 x 4.5 x 5.2 cm and heterogeneous.  She had a CA125 collected on 08/10/2022 which returned as 9.  Today, patient reports that she has been having right lower quadrant pain since November.  She describes the pain as constant soreness, sometimes throbbing.  NSAIDs do not not help.  Relaxing sometimes helps.  Activity or bending makes it worse as well as lifting.  She also notes abdominal bloating since November.  She otherwise denies early satiety, significant weight loss, change in bowel or bladder habits other than no bowel movement in the last day or 2.  Patient has a history of total abdominal hysterectomy in September 2002 for dysmenorrhea and  menorrhagia.  Operative report noted visual finding of endometriosis.   Pathology notes fibroids.   PAST MEDICAL HISTORY: Past Medical History:  Diagnosis Date   Anxiety    Arthritis    Arthritis    Asthma    Depression    GERD (gastroesophageal reflux disease)    Headache    migraines   Hypertension    Hyperthyroidism     PAST SURGICAL HISTORY: Past Surgical History:  Procedure Laterality Date   ABDOMINAL HYSTERECTOMY     CERVICAL SPINE SURGERY  2023   CESAREAN SECTION     CHOLECYSTECTOMY     TONSILLECTOMY     TOTAL HIP ARTHROPLASTY Right 08/11/2017   Procedure: RIGHT TOTAL HIP ARTHROPLASTY ANTERIOR APPROACH;  Surgeon: Rod Can, MD;  Location: [facility];  Service: Orthopedics;  Laterality: Right;  Needs RNFA   TUBAL LIGATION Bilateral    VARICOSE VEIN SURGERY     WRIST SURGERY     Left    OB/GYN HISTORY: OB History  Gravida Para Term Preterm AB Living  '2 2 2     2  '$ SAB IAB Ectopic Multiple Live Births          2    # Outcome Date GA Lbr Len/2nd Weight Sex Delivery Anes PTL Lv  2 Term      CS-Unspec   LIV  1 Term      Vag-Spont   LIV      Age at menarche: 74 Age at menopause: Status post hysterectomy age 22 Hx of HRT: No Hx of STI: No Last pap: prior to hyst History of abnormal pap smears: none  SCREENING STUDIES:  Last mammogram: 07/2021 Last colonoscopy: Fecal testing 2 years ago, colonoscopy reported years ago  MEDICATIONS:  Current Outpatient Medications:    diclofenac (VOLTAREN) 75 MG EC tablet, Take 75 mg by mouth 2 (two) times daily., Disp: , Rfl: 0   Enalapril-Hydrochlorothiazide 5-12.5 MG tablet, Take 1 tablet by mouth daily., Disp: , Rfl: 2   methimazole (TAPAZOLE) 5 MG tablet, Take 5 mg by mouth daily., Disp: , Rfl: 1   omeprazole (PRILOSEC) 40 MG capsule, Take 40 mg by mouth 2 (two) times daily., Disp: , Rfl: 1   rosuvastatin (CRESTOR) 10 MG tablet, Take 1 tablet (10 mg total) by mouth daily. SCHEDULE OFFICE VISIT FOR REFILLS, Disp: 30 tablet, Rfl: 0   senna-docusate (SENOKOT-S) 8.6-50 MG tablet, Take 2  tablets by mouth at bedtime. For AFTER surgery, do not take if having diarrhea, Disp: 30 tablet, Rfl: 0   traMADol (ULTRAM) 50 MG tablet, Take 1 tablet (50 mg total) by mouth every 6 (six) hours as needed for severe pain. For AFTER surgery only, do not take and drive, Disp: 10 tablet, Rfl: 0   venlafaxine XR (EFFEXOR-XR) 75 MG 24 hr capsule, Take 75 mg by mouth daily., Disp: , Rfl: 1  ALLERGIES: Allergies  Allergen Reactions   Sulfonamide Derivatives Anaphylaxis and Hives    FAMILY HISTORY: Family History  Problem Relation Age of Onset   Hypertension Mother    Heart attack Father 52   Hyperlipidemia Brother    Heart attack Maternal Grandfather    Heart attack Paternal Grandfather    Breast cancer Neg Hx    Ovarian cancer Neg Hx    Endometrial cancer Neg Hx    Colon cancer Neg Hx     SOCIAL HISTORY: Social History   Socioeconomic History   Marital status: Married    Spouse name: Not on file  Number of children: Not on file   Years of education: Not on file   Highest education level: Not on file  Occupational History   Occupation: daycare  Tobacco Use   Smoking status: Every Day    Years: 10.00    Types: Cigarettes    Last attempt to quit: 08/06/2015    Years since quitting: 7.1   Smokeless tobacco: Never  Vaping Use   Vaping Use: Never used  Substance and Sexual Activity   Alcohol use: No   Drug use: No   Sexual activity: Not Currently  Other Topics Concern   Not on file  Social History Narrative   Not on file   Social Determinants of Health   Financial Resource Strain: Not on file  Food Insecurity: No Food Insecurity (09/08/2022)   Hunger Vital Sign    Worried About Running Out of Food in the Last Year: Never true    Ran Out of Food in the Last Year: Never true  Transportation Needs: No Transportation Needs (09/08/2022)   PRAPARE - Hydrologist (Medical): No    Lack of Transportation (Non-Medical): No  Physical Activity: Not on  file  Stress: Not on file  Social Connections: Moderately Isolated (09/08/2022)   Social Connection and Isolation Panel [NHANES]    Frequency of Communication with Friends and Family: More than three times a week    Frequency of Social Gatherings with Friends and Family: More than three times a week    Attends Religious Services: Never    Marine scientist or Organizations: No    Attends Archivist Meetings: Never    Marital Status: Married  Human resources officer Violence: Not At Risk (09/08/2022)   Humiliation, Afraid, Rape, and Kick questionnaire    Fear of Current or Ex-Partner: No    Emotionally Abused: No    Physically Abused: No    Sexually Abused: No    REVIEW OF SYSTEMS: New patient intake form was reviewed.  Complete 10-system review is negative except for the following: Joint pain, bloating, pelvic pain, back pain, numbness, fatigue, abdominal pain, incontinence, hot flashes  PHYSICAL EXAM: BP (!) 143/70 (BP Location: Left Arm, Patient Position: Sitting)   Pulse 85   Temp 98.4 F (36.9 C) (Oral)   Resp 16   Ht '5\' 3"'$  (1.6 m)   Wt 228 lb (103.4 kg)   SpO2 100%   BMI 40.39 kg/m  Constitutional: No acute distress. Neuro/Psych: Alert, oriented.  Head and Neck: Normocephalic, atraumatic. Neck symmetric without masses. Sclera anicteric.  Respiratory: Normal work of breathing. Clear to auscultation bilaterally. Cardiovascular: Regular rate and rhythm, no murmurs, rubs, or gallops. Abdomen: Normoactive bowel sounds. Soft, non-distended, non-tender to palpation. No masses or hepatosplenomegaly appreciated. No evidence of hernia. No palpable fluid wave. Well healed pfannenstiel incision. Extremities: Grossly normal range of motion. Warm, well perfused. No edema bilaterally. Skin: No rashes or lesions. Lymphatic: No cervical, supraclavicular, or inguinal adenopathy. Genitourinary: External genitalia without lesions. Urethral meatus without lesions or prolapse. On speculum  exam, vagina without lesions. Bimanual exam reveals smooth vaginal cuff. Slight fullness of the right adnexa but mass not discretely palpated, exam limited by body habitus. Rectovaginal exam confirms the above findings and reveals normal sphincter tone and no masses or nodularity. Exam chaperoned by [name], NP   LABORATORY AND RADIOLOGIC DATA: Outside medical records were reviewed to synthesize the above history, along with the history and physical obtained during the visit.  Outside  laboratory, pathology, and imaging reports were reviewed, with pertinent results below.   WBC  Date Value Ref Range Status  08/12/2017 11.3 (H) 4.0 - 10.5 K/uL Final   Hemoglobin  Date Value Ref Range Status  08/12/2017 10.7 (L) 12.0 - 15.0 g/dL Final   HCT  Date Value Ref Range Status  08/12/2017 32.4 (L) 36.0 - 46.0 % Final   Platelets  Date Value Ref Range Status  08/12/2017 272 150 - 400 K/uL Final   Creatinine, Ser  Date Value Ref Range Status  08/12/2017 0.77 0.44 - 1.00 mg/dL Final   AST  Date Value Ref Range Status  03/07/2009 18 0 - 37 units/L Final   ALT  Date Value Ref Range Status  03/07/2009 16 0 - 35 units/L Final   CA125 (08/10/2022): 9  Pelvic ultrasound (06/21/2022): Right ovary enlarged.  Measures 4.6 x 4.5 x 5.2 cm and appears heterogeneous.  Left ovary is not visualized.

## 2022-09-13 NOTE — Progress Notes (Signed)
Patient here for new patient consultation with Dr. Bernadene Bell and for a pre-operative appointment prior to her scheduled surgery on October 05, 2022. She is scheduled for robotic assisted laparoscopic bilateral salpingo-oophorectomy, possible staging if a pre-cancer or cancer is seen, possible laparotomy. The surgery was discussed in detail.  See after visit summary for additional details.    Discussed post-op pain management in detail including the aspects of the enhanced recovery pathway.  Advised her that a new prescription would be sent in for tramadol and it is only to be used for after her upcoming surgery.  We discussed the use of tylenol post-op and to monitor for a maximum of 4,000 mg in a 24 hour period.  Also prescribed sennakot to be used after surgery and to hold if having loose stools.  Discussed bowel regimen in detail.     Discussed the use of SCDs and measures to take at home to prevent DVT including frequent mobility.  Reportable signs and symptoms of DVT discussed. Post-operative instructions discussed and expectations for after surgery. Incisional care discussed as well including reportable signs and symptoms including erythema, drainage, wound separation.     5 minutes spent with the patient, 5 mins preparing information.  Verbalizing understanding of material discussed. No needs or concerns voiced at the end of the visit.   Advised patient to call for any needs.  Advised that her post-operative medications had been prescribed and could be picked up at any time.    This appointment is included in the global surgical bundle as pre-operative teaching and has no charge.

## 2022-09-13 NOTE — Patient Instructions (Signed)
Plan to have a CT scan prior to surgery and you will be contacted with the results. You will need to arrive 2 hours before your scan to begin drinking the oral contrast. Nothing to eat or drink 4 hours before your scan as well.   Preparing for your Surgery   Plan for surgery on October 05, 2022 with Dr. Bernadene Bell at [facility] will be scheduled for robotic assisted laparoscopic bilateral salpingo-oophorectomy (removal of both ovaries and fallopian tubes), possible staging if a pre-cancer or cancer is seen, possible laparotomy (larger incision on your abdomen if needed).    There may be a sooner opening on Feb 27. We will call you by the end of the day to let you know if this is available.    Pre-operative Testing -You will receive a phone call from presurgical testing at [facility] to arrange for a pre-operative appointment and lab work.   -Bring your insurance card, copy of an advanced directive if applicable, medication list   -At that visit, you will be asked to sign a consent for a possible blood transfusion in case a transfusion becomes necessary during surgery.  The need for a blood transfusion is rare but having consent is a necessary part of your care.      -You should not be taking blood thinners or aspirin at least ten days prior to surgery unless instructed by your surgeon.   -Do not take supplements such as fish oil (omega 3), red yeast rice, turmeric before your surgery. You want to avoid medications with aspirin in them including headache powders such as BC or Goody's), Excedrin migraine.   -YOU WILL NEED TO STOP TAKING VOLTAREN TABLETS AT LEAST 7 DAYS BEFORE SURGERY SINCE THIS CAN THIN YOUR BLOOD   Day Before Surgery at [place] will be asked to take in a light diet the day before surgery. You will be advised you can have clear liquids up until 3 hours before your surgery.     Eat a light diet the day before surgery.  Examples including soups,  broths, toast, yogurt, mashed potatoes.  AVOID GAS PRODUCING FOODS AND BEVERAGES. Things to avoid include carbonated beverages (fizzy beverages, sodas), raw fruits and raw vegetables (uncooked), or beans.    If your bowels are filled with gas, your surgeon will have difficulty visualizing your pelvic organs which increases your surgical risks.   Your role in recovery Your role is to become active as soon as directed by your doctor, while still giving yourself time to heal.  Rest when you feel tired. You will be asked to do the following in order to speed your recovery:   - Cough and breathe deeply. This helps to clear and expand your lungs and can prevent pneumonia after surgery.  - [place]. Do mild physical activity. Walking or moving your legs help your circulation and body functions return to normal. Do not try to get up or walk alone the first time after surgery.   -If you develop swelling on one leg or the other, pain in the back of your leg, redness/warmth in one of your legs, please call the office or go to the Emergency Room to have a doppler to rule out a blood clot. For shortness of breath, chest pain-seek care in the Emergency Room as soon as possible. - Actively manage your pain. Managing your pain lets you move in comfort. We will ask you to rate your  pain on a scale of zero to 10. It is your responsibility to tell your doctor or nurse where and how much you hurt so your pain can be treated.   Special Considerations -If you are diabetic, you may be placed on insulin after surgery to have closer control over your blood sugars to promote healing and recovery.  This does not mean that you will be discharged on insulin.  If applicable, your oral antidiabetics will be resumed when you are tolerating a solid diet.   -Your final pathology results from surgery should be available around one week after surgery and the results will be relayed to you when available.   -Dr.  Lahoma Crocker is the surgeon that assists your GYN Oncologist with surgery.  If you end up staying the night, the next day after your surgery you will either see Dr. Berline Lopes, Dr. Ernestina Patches, or Dr. Lahoma Crocker.   -FMLA forms can be faxed to [phone] and please allow 5-7 business days for completion.   Pain Management After Surgery -You have been prescribed your pain medication and bowel regimen medications before surgery so that you can have these available when you are discharged from the hospital. The pain medication is for use ONLY AFTER surgery and a new prescription will not be given.    -Make sure that you have Tylenol and Ibuprofen (OR VOLTAREN, DO NOT TAKE TOGETHER SINCE THEY WORK SIMILARILY) IF YOU ARE ABLE TO TAKE THESE MEDICATIONS at home to use on a regular basis after surgery for pain control. We recommend alternating the medications every hour to six hours since they work differently and are processed in the body differently for pain relief.   -Review the attached handout on narcotic use and their risks and side effects.    Bowel Regimen -You have been prescribed Sennakot-S to take nightly to prevent constipation especially if you are taking the narcotic pain medication intermittently.  It is important to prevent constipation and drink adequate amounts of liquids. You can stop taking this medication when you are not taking pain medication and you are back on your normal bowel routine.   Risks of Surgery Risks of surgery are low but include bleeding, infection, damage to surrounding structures, re-operation, blood clots, and very rarely death.     Blood Transfusion Information (For the consent to be signed before surgery)   We will be checking your blood type before surgery so in case of emergencies, we will know what type of blood you would need.                                             WHAT IS A BLOOD TRANSFUSION?   A transfusion is the replacement of blood or  some of its parts. Blood is made up of multiple cells which provide different functions. Red blood cells carry oxygen and are used for blood loss replacement. White blood cells fight against infection. Platelets control bleeding. Plasma helps clot blood. Other blood products are available for specialized needs, such as hemophilia or other clotting disorders. BEFORE THE TRANSFUSION  Who gives blood for transfusions?  You may be able to donate blood to be used at a later date on yourself (autologous donation). Relatives can be asked to donate blood. This is generally not any safer than if you have received blood from a stranger. The same precautions are taken  to ensure safety when a relative's blood is donated. Healthy volunteers who are fully evaluated to make sure their blood is safe. This is blood bank blood. Transfusion therapy is the safest it has ever been in the practice of medicine. Before blood is taken from a donor, a complete history is taken to make sure that person has no history of diseases nor engages in risky social behavior (examples are intravenous drug use or sexual activity with multiple partners). The donor's travel history is screened to minimize risk of transmitting infections, such as malaria. The donated blood is tested for signs of infectious diseases, such as HIV and hepatitis. The blood is then tested to be sure it is compatible with you in order to minimize the chance of a transfusion reaction. If you or a relative donates blood, this is often done in anticipation of surgery and is not appropriate for emergency situations. It takes many days to process the donated blood. RISKS AND COMPLICATIONS Although transfusion therapy is very safe and saves many lives, the main dangers of transfusion include:  Getting an infectious disease. Developing a transfusion reaction. This is an allergic reaction to something in the blood you were given. Every precaution is taken to prevent  this. The decision to have a blood transfusion has been considered carefully by your caregiver before blood is given. Blood is not given unless the benefits outweigh the risks.   AFTER SURGERY INSTRUCTIONS   Return to work: 4-6 weeks if applicable   Activity: 1. Be up and out of the bed during the day.  Take a nap if needed.  You may walk up steps but be careful and use the hand rail.  Stair climbing will tire you more than you think, you may need to stop part way and rest.    2. No lifting or straining for 6 weeks over 10 pounds. No pushing, pulling, straining for 6 weeks.   3. No driving for around 1 week(s).  Do not drive if you are taking narcotic pain medicine and make sure that your reaction time has returned.    4. You can shower as soon as the next day after surgery. Shower daily.  Use your regular soap and water (not directly on the incision) and pat your incision(s) dry afterwards; don't rub.  No tub baths or submerging your body in water until cleared by your surgeon. If you have the soap that was given to you by pre-surgical testing that was used before surgery, you do not need to use it afterwards because this can irritate your incisions.    5. No sexual activity and nothing in the vagina for 4 weeks.   6. You may experience a small amount of clear drainage from your incisions, which is normal.  If the drainage persists, increases, or changes color please call the office.   7. Do not use creams, lotions, or ointments such as neosporin on your incisions after surgery until advised by your surgeon because they can cause removal of the dermabond glue on your incisions.     8. Take Tylenol or ibuprofen (or voltaren, do not take together) first for pain if you are able to take these medications and only use narcotic pain medication for severe pain not relieved by the Tylenol or Ibuprofen.  Monitor your Tylenol intake to a max of 4,000 mg in a 24 hour period.    Diet: 1. Low sodium  Heart Healthy Diet is recommended but you are cleared to resume  your normal (before surgery) diet after your procedure.   2. It is safe to use a laxative, such as Miralax or Colace, if you have difficulty moving your bowels. You have been prescribed Sennakot-S to take at bedtime every evening after surgery to keep bowel movements regular and to prevent constipation.     Wound Care: 1. Keep clean and dry.  Shower daily.   Reasons to call the Doctor: Fever - Oral temperature greater than 100.4 degrees Fahrenheit Foul-smelling vaginal discharge Difficulty urinating Nausea and vomiting Increased pain at the site of the incision that is unrelieved with pain medicine. Difficulty breathing with or without chest pain New calf pain especially if only on one side Sudden, continuing increased vaginal bleeding with or without clots.   Contacts: For questions or concerns you should contact:   Dr. Bernadene Bell at [facility], NP at [phone]   After Hours: call [phone] and have the GYN Oncologist paged/contacted (after 5 pm or on the weekends).   Messages sent via mychart are for non-urgent matters and are not responded to after hours so for urgent needs, please call the after hours number.

## 2022-09-13 NOTE — Progress Notes (Signed)
GYNECOLOGIC ONCOLOGY NEW PATIENT CONSULTATION  Date of Service: 09/13/2022 Referring Provider: Vania Rea, MD   ASSESSMENT AND PLAN: Lynn Newman is a 57 y.o. woman with 5.2cm heterogenous right ovary and RLQ pain.  We reviewed that the exact etiology of the pelvic mass is unclear, but could include a benign, borderline, or malignant process.  The recommended treatment is surgical excision to make a definitive diagnosis.  Ultrasound report reviewed. Images not available for reviewed.  Recommend preoperative CT abdomen/pelvis  Because the mass is relatively small, we feel that a minimally invasive approach is feasible, using robotic assistance.    In the event of malignancy or borderline tumor on frozen section, we will perform indicated staging procedures. We discussed that these procedures may include omentectomy pelvic and/or para-aortic lymphadenectomy, peritoneal biopsies. We would also remove any tissue concerning for metastatic disease which could require additional procedures including bowel surgery.  Patient was consented for: Robotic assisted laparoscopic bilateral salpingo-oophorectomy, possible staging with possible pelvic/periotic lymph node dissection, biopsies, omentectomy, mini laparotomy on 10/05/2022. We discussed that OR date of 2/27 may become available.  Patient would be interested in moving up if available.  The risks of surgery were discussed in detail and she understands these to including but not limited to bleeding requiring a blood transfusion, infection, injury to adjacent organs (including but not limited to the bowels, bladder, ureters, nerves, blood vessels), thromboembolic events, wound separation, hernia, possible risk of lymphedema and lymphocyst if lymphadenectomy performed, unforseen complication, and possible need for re-exploration.  If the patient experiences any of these events, she understands that her hospitalization or recovery may be prolonged and  that she may need to take additional medications for a prolonged period. The patient will receive DVT and antibiotic prophylaxis as indicated. She voiced a clear understanding. She had the opportunity to ask questions and informed consent was obtained today. She wishes to proceed.  She does not require preoperative clearance. Her METs are >4.  All preoperative instructions were reviewed. Postoperative expectations were also reviewed.    A copy of this note was sent to the patient's referring provider.  Bernadene Bell, MD Gynecologic Oncology   Medical Decision Making I personally spent  TOTAL 45 minutes face-to-face and non-face-to-face in the care of this patient, which includes all pre, intra, and post visit time on the date of service.   ------------  CC: Enlarged ovary  HISTORY OF PRESENT ILLNESS:  Lynn Newman is a 57 y.o. woman who is seen in consultation at the request of Vania Rea, MD for evaluation of an enlarged heterogeneous ovary and right lower quadrant pain.  Patient was seen in follow-up by her OB/GYN on 08/25/2022.  She had noted pain and bloating for about 2 months.  An ultrasound was done in November 2023 which showed a right ovary measuring 4.6 x 4.5 x 5.2 cm and heterogeneous.  She had a CA125 collected on 08/10/2022 which returned as 9.  Today, patient reports that she has been having right lower quadrant pain since November.  She describes the pain as constant soreness, sometimes throbbing.  NSAIDs do not not help.  Relaxing sometimes helps.  Activity or bending makes it worse as well as lifting.  She also notes abdominal bloating since November.  She otherwise denies early satiety, significant weight loss, change in bowel or bladder habits other than no bowel movement in the last day or 2.  Patient has a history of total abdominal hysterectomy in September 2002 for dysmenorrhea and  menorrhagia.  Operative report noted visual finding of endometriosis.   Pathology notes fibroids.   PAST MEDICAL HISTORY: Past Medical History:  Diagnosis Date   Anxiety    Arthritis    Arthritis    Asthma    Depression    GERD (gastroesophageal reflux disease)    Headache    migraines   Hypertension    Hyperthyroidism     PAST SURGICAL HISTORY: Past Surgical History:  Procedure Laterality Date   ABDOMINAL HYSTERECTOMY     CERVICAL SPINE SURGERY  2023   CESAREAN SECTION     CHOLECYSTECTOMY     TONSILLECTOMY     TOTAL HIP ARTHROPLASTY Right 08/11/2017   Procedure: RIGHT TOTAL HIP ARTHROPLASTY ANTERIOR APPROACH;  Surgeon: Rod Can, MD;  Location: [facility];  Service: Orthopedics;  Laterality: Right;  Needs RNFA   TUBAL LIGATION Bilateral    VARICOSE VEIN SURGERY     WRIST SURGERY     Left    OB/GYN HISTORY: OB History  Gravida Para Term Preterm AB Living  2 2 2     2  $ SAB IAB Ectopic Multiple Live Births          2    # Outcome Date GA Lbr Len/2nd Weight Sex Delivery Anes PTL Lv  2 Term      CS-Unspec   LIV  1 Term      Vag-Spont   LIV      Age at menarche: 89 Age at menopause: Status post hysterectomy age 79 Hx of HRT: No Hx of STI: No Last pap: prior to hyst History of abnormal pap smears: none  SCREENING STUDIES:  Last mammogram: 07/2021 Last colonoscopy: Fecal testing 2 years ago, colonoscopy reported years ago  MEDICATIONS:  Current Outpatient Medications:    diclofenac (VOLTAREN) 75 MG EC tablet, Take 75 mg by mouth 2 (two) times daily., Disp: , Rfl: 0   Enalapril-Hydrochlorothiazide 5-12.5 MG tablet, Take 1 tablet by mouth daily., Disp: , Rfl: 2   methimazole (TAPAZOLE) 5 MG tablet, Take 5 mg by mouth daily., Disp: , Rfl: 1   omeprazole (PRILOSEC) 40 MG capsule, Take 40 mg by mouth 2 (two) times daily., Disp: , Rfl: 1   rosuvastatin (CRESTOR) 10 MG tablet, Take 1 tablet (10 mg total) by mouth daily. SCHEDULE OFFICE VISIT FOR REFILLS, Disp: 30 tablet, Rfl: 0   senna-docusate (SENOKOT-S) 8.6-50 MG tablet, Take 2  tablets by mouth at bedtime. For AFTER surgery, do not take if having diarrhea, Disp: 30 tablet, Rfl: 0   traMADol (ULTRAM) 50 MG tablet, Take 1 tablet (50 mg total) by mouth every 6 (six) hours as needed for severe pain. For AFTER surgery only, do not take and drive, Disp: 10 tablet, Rfl: 0   venlafaxine XR (EFFEXOR-XR) 75 MG 24 hr capsule, Take 75 mg by mouth daily., Disp: , Rfl: 1  ALLERGIES: Allergies  Allergen Reactions   Sulfonamide Derivatives Anaphylaxis and Hives    FAMILY HISTORY: Family History  Problem Relation Age of Onset   Hypertension Mother    Heart attack Father 4   Hyperlipidemia Brother    Heart attack Maternal Grandfather    Heart attack Paternal Grandfather    Breast cancer Neg Hx    Ovarian cancer Neg Hx    Endometrial cancer Neg Hx    Colon cancer Neg Hx     SOCIAL HISTORY: Social History   Socioeconomic History   Marital status: Married    Spouse name: Not on file  Number of children: Not on file   Years of education: Not on file   Highest education level: Not on file  Occupational History   Occupation: daycare  Tobacco Use   Smoking status: Every Day    Years: 10.00    Types: Cigarettes    Last attempt to quit: 08/06/2015    Years since quitting: 7.1   Smokeless tobacco: Never  Vaping Use   Vaping Use: Never used  Substance and Sexual Activity   Alcohol use: No   Drug use: No   Sexual activity: Not Currently  Other Topics Concern   Not on file  Social History Narrative   Not on file   Social Determinants of Health   Financial Resource Strain: Not on file  Food Insecurity: No Food Insecurity (09/08/2022)   Hunger Vital Sign    Worried About Running Out of Food in the Last Year: Never true    Ran Out of Food in the Last Year: Never true  Transportation Needs: No Transportation Needs (09/08/2022)   PRAPARE - Hydrologist (Medical): No    Lack of Transportation (Non-Medical): No  Physical Activity: Not on  file  Stress: Not on file  Social Connections: Moderately Isolated (09/08/2022)   Social Connection and Isolation Panel [NHANES]    Frequency of Communication with Friends and Family: More than three times a week    Frequency of Social Gatherings with Friends and Family: More than three times a week    Attends Religious Services: Never    Marine scientist or Organizations: No    Attends Archivist Meetings: Never    Marital Status: Married  Human resources officer Violence: Not At Risk (09/08/2022)   Humiliation, Afraid, Rape, and Kick questionnaire    Fear of Current or Ex-Partner: No    Emotionally Abused: No    Physically Abused: No    Sexually Abused: No    REVIEW OF SYSTEMS: New patient intake form was reviewed.  Complete 10-system review is negative except for the following: Joint pain, bloating, pelvic pain, back pain, numbness, fatigue, abdominal pain, incontinence, hot flashes  PHYSICAL EXAM: BP (!) 143/70 (BP Location: Left Arm, Patient Position: Sitting)   Pulse 85   Temp 98.4 F (36.9 C) (Oral)   Resp 16   Ht 5' 3"$  (1.6 m)   Wt 228 lb (103.4 kg)   SpO2 100%   BMI 40.39 kg/m  Constitutional: No acute distress. Neuro/Psych: Alert, oriented.  Head and Neck: Normocephalic, atraumatic. Neck symmetric without masses. Sclera anicteric.  Respiratory: Normal work of breathing. Clear to auscultation bilaterally. Cardiovascular: Regular rate and rhythm, no murmurs, rubs, or gallops. Abdomen: Normoactive bowel sounds. Soft, non-distended, non-tender to palpation. No masses or hepatosplenomegaly appreciated. No evidence of hernia. No palpable fluid wave. Well healed pfannenstiel incision. Extremities: Grossly normal range of motion. Warm, well perfused. No edema bilaterally. Skin: No rashes or lesions. Lymphatic: No cervical, supraclavicular, or inguinal adenopathy. Genitourinary: External genitalia without lesions. Urethral meatus without lesions or prolapse. On speculum  exam, vagina without lesions. Bimanual exam reveals smooth vaginal cuff. Slight fullness of the right adnexa but mass not discretely palpated, exam limited by body habitus. Rectovaginal exam confirms the above findings and reveals normal sphincter tone and no masses or nodularity. Exam chaperoned by [name], NP   LABORATORY AND RADIOLOGIC DATA: Outside medical records were reviewed to synthesize the above history, along with the history and physical obtained during the visit.  Outside  laboratory, pathology, and imaging reports were reviewed, with pertinent results below.   WBC  Date Value Ref Range Status  08/12/2017 11.3 (H) 4.0 - 10.5 K/uL Final   Hemoglobin  Date Value Ref Range Status  08/12/2017 10.7 (L) 12.0 - 15.0 g/dL Final   HCT  Date Value Ref Range Status  08/12/2017 32.4 (L) 36.0 - 46.0 % Final   Platelets  Date Value Ref Range Status  08/12/2017 272 150 - 400 K/uL Final   Creatinine, Ser  Date Value Ref Range Status  08/12/2017 0.77 0.44 - 1.00 mg/dL Final   AST  Date Value Ref Range Status  03/07/2009 18 0 - 37 units/L Final   ALT  Date Value Ref Range Status  03/07/2009 16 0 - 35 units/L Final   CA125 (08/10/2022): 9  Pelvic ultrasound (06/21/2022): Right ovary enlarged.  Measures 4.6 x 4.5 x 5.2 cm and appears heterogeneous.  Left ovary is not visualized.

## 2022-09-13 NOTE — Patient Instructions (Addendum)
Plan to have a CT scan prior to surgery and you will be contacted with the results. You will need to arrive 2 hours before your scan to begin drinking the oral contrast. Nothing to eat or drink 4 hours before your scan as well.  Preparing for your Surgery  Plan for surgery on October 05, 2022 with Dr. Bernadene Newman at [facility] will be scheduled for robotic assisted laparoscopic bilateral salpingo-oophorectomy (removal of both ovaries and fallopian tubes), possible staging if a pre-cancer or cancer is seen, possible laparotomy (larger incision on your abdomen if needed).   There may be a sooner opening on Feb 27. We will call you by the end of the day to let you know if this is available.   Pre-operative Testing -You will receive a phone call from presurgical testing at [facility] to arrange for a pre-operative appointment and lab work.  -Bring your insurance card, copy of an advanced directive if applicable, medication list  -At that visit, you will be asked to sign a consent for a possible blood transfusion in case a transfusion becomes necessary during surgery.  The need for a blood transfusion is rare but having consent is a necessary part of your care.     -You should not be taking blood thinners or aspirin at least ten days prior to surgery unless instructed by your surgeon.  -Do not take supplements such as fish oil (omega 3), red yeast rice, turmeric before your surgery. You want to avoid medications with aspirin in them including headache powders such as BC or Goody's), Excedrin migraine.  -YOU WILL NEED TO STOP TAKING VOLTAREN TABLETS AT LEAST 7 DAYS BEFORE SURGERY SINCE THIS CAN THIN YOUR BLOOD  Day Before Surgery at [facility] will be asked to take in a light diet the day before surgery. You will be advised you can have clear liquids up until 3 hours before your surgery.    Eat a light diet the day before surgery.  Examples including soups, broths, toast,  yogurt, mashed potatoes.  AVOID GAS PRODUCING FOODS AND BEVERAGES. Things to avoid include carbonated beverages (fizzy beverages, sodas), raw fruits and raw vegetables (uncooked), or beans.   If your bowels are filled with gas, your surgeon will have difficulty visualizing your pelvic organs which increases your surgical risks.  Your role in recovery Your role is to become active as soon as directed by your doctor, while still giving yourself time to heal.  Rest when you feel tired. You will be asked to do the following in order to speed your recovery:  - Cough and breathe deeply. This helps to clear and expand your lungs and can prevent pneumonia after surgery.  - [place]. Do mild physical activity. Walking or moving your legs help your circulation and body functions return to normal. Do not try to get up or walk alone the first time after surgery.   -If you develop swelling on one leg or the other, pain in the back of your leg, redness/warmth in one of your legs, please call the office or go to the Emergency Room to have a doppler to rule out a blood clot. For shortness of breath, chest pain-seek care in the Emergency Room as soon as possible. - Actively manage your pain. Managing your pain lets you move in comfort. We will ask you to rate your pain on a scale of zero to 10. It is your responsibility to tell  your doctor or nurse where and how much you hurt so your pain can be treated.  Special Considerations -If you are diabetic, you may be placed on insulin after surgery to have closer control over your blood sugars to promote healing and recovery.  This does not mean that you will be discharged on insulin.  If applicable, your oral antidiabetics will be resumed when you are tolerating a solid diet.  -Your final pathology results from surgery should be available around one week after surgery and the results will be relayed to you when available.  -Dr. Lahoma Crocker is  the surgeon that assists your GYN Oncologist with surgery.  If you end up staying the night, the next day after your surgery you will either see Dr. Berline Lopes, Dr. Ernestina Patches, or Dr. Lahoma Crocker.  -FMLA forms can be faxed to [phone] and please allow 5-7 business days for completion.  Pain Management After Surgery -You have been prescribed your pain medication and bowel regimen medications before surgery so that you can have these available when you are discharged from the hospital. The pain medication is for use ONLY AFTER surgery and a new prescription will not be given.   -Make sure that you have Tylenol and Ibuprofen (OR VOLTAREN, DO NOT TAKE TOGETHER SINCE THEY WORK SIMILARILY) IF YOU ARE ABLE TO TAKE THESE MEDICATIONS at home to use on a regular basis after surgery for pain control. We recommend alternating the medications every hour to six hours since they work differently and are processed in the body differently for pain relief.  -Review the attached handout on narcotic use and their risks and side effects.   Bowel Regimen -You have been prescribed Sennakot-S to take nightly to prevent constipation especially if you are taking the narcotic pain medication intermittently.  It is important to prevent constipation and drink adequate amounts of liquids. You can stop taking this medication when you are not taking pain medication and you are back on your normal bowel routine.  Risks of Surgery Risks of surgery are low but include bleeding, infection, damage to surrounding structures, re-operation, blood clots, and very rarely death.   Blood Transfusion Information (For the consent to be signed before surgery)  We will be checking your blood type before surgery so in case of emergencies, we will know what type of blood you would need.                                            WHAT IS A BLOOD TRANSFUSION?  A transfusion is the replacement of blood or some of its parts. Blood is made up  of multiple cells which provide different functions. Red blood cells carry oxygen and are used for blood loss replacement. White blood cells fight against infection. Platelets control bleeding. Plasma helps clot blood. Other blood products are available for specialized needs, such as hemophilia or other clotting disorders. BEFORE THE TRANSFUSION  Who gives blood for transfusions?  You may be able to donate blood to be used at a later date on yourself (autologous donation). Relatives can be asked to donate blood. This is generally not any safer than if you have received blood from a stranger. The same precautions are taken to ensure safety when a relative's blood is donated. Healthy volunteers who are fully evaluated to make sure their blood is safe. This is blood bank blood. Transfusion  therapy is the safest it has ever been in the practice of medicine. Before blood is taken from a donor, a complete history is taken to make sure that person has no history of diseases nor engages in risky social behavior (examples are intravenous drug use or sexual activity with multiple partners). The donor's travel history is screened to minimize risk of transmitting infections, such as malaria. The donated blood is tested for signs of infectious diseases, such as HIV and hepatitis. The blood is then tested to be sure it is compatible with you in order to minimize the chance of a transfusion reaction. If you or a relative donates blood, this is often done in anticipation of surgery and is not appropriate for emergency situations. It takes many days to process the donated blood. RISKS AND COMPLICATIONS Although transfusion therapy is very safe and saves many lives, the main dangers of transfusion include:  Getting an infectious disease. Developing a transfusion reaction. This is an allergic reaction to something in the blood you were given. Every precaution is taken to prevent this. The decision to have a blood  transfusion has been considered carefully by your caregiver before blood is given. Blood is not given unless the benefits outweigh the risks.  AFTER SURGERY INSTRUCTIONS  Return to work: 4-6 weeks if applicable  Activity: 1. Be up and out of the bed during the day.  Take a nap if needed.  You may walk up steps but be careful and use the hand rail.  Stair climbing will tire you more than you think, you may need to stop part way and rest.   2. No lifting or straining for 6 weeks over 10 pounds. No pushing, pulling, straining for 6 weeks.  3. No driving for around 1 week(s).  Do not drive if you are taking narcotic pain medicine and make sure that your reaction time has returned.   4. You can shower as soon as the next day after surgery. Shower daily.  Use your regular soap and water (not directly on the incision) and pat your incision(s) dry afterwards; don't rub.  No tub baths or submerging your body in water until cleared by your surgeon. If you have the soap that was given to you by pre-surgical testing that was used before surgery, you do not need to use it afterwards because this can irritate your incisions.   5. No sexual activity and nothing in the vagina for 4 weeks.  6. You may experience a small amount of clear drainage from your incisions, which is normal.  If the drainage persists, increases, or changes color please call the office.  7. Do not use creams, lotions, or ointments such as neosporin on your incisions after surgery until advised by your surgeon because they can cause removal of the dermabond glue on your incisions.    8. Take Tylenol or ibuprofen (or voltaren, do not take together) first for pain if you are able to take these medications and only use narcotic pain medication for severe pain not relieved by the Tylenol or Ibuprofen.  Monitor your Tylenol intake to a max of 4,000 mg in a 24 hour period.   Diet: 1. Low sodium Heart Healthy Diet is recommended but you are  cleared to resume your normal (before surgery) diet after your procedure.  2. It is safe to use a laxative, such as Miralax or Colace, if you have difficulty moving your bowels. You have been prescribed Sennakot-S to take at bedtime every  evening after surgery to keep bowel movements regular and to prevent constipation.    Wound Care: 1. Keep clean and dry.  Shower daily.  Reasons to call the Doctor: Fever - Oral temperature greater than 100.4 degrees Fahrenheit Foul-smelling vaginal discharge Difficulty urinating Nausea and vomiting Increased pain at the site of the incision that is unrelieved with pain medicine. Difficulty breathing with or without chest pain New calf pain especially if only on one side Sudden, continuing increased vaginal bleeding with or without clots.   Contacts: For questions or concerns you should contact:  Dr. Bernadene Newman at [place], NP at [phone]  After Hours: call [phone] and have the GYN Oncologist paged/contacted (after 5 pm or on the weekends).  Messages sent via mychart are for non-urgent matters and are not responded to after hours so for urgent needs, please call the after hours number.

## 2022-09-20 ENCOUNTER — Telehealth: Payer: Self-pay | Admitting: Gynecologic Oncology

## 2022-09-20 ENCOUNTER — Encounter: Payer: Self-pay | Admitting: Psychiatry

## 2022-09-20 ENCOUNTER — Other Ambulatory Visit: Payer: Self-pay | Admitting: Gynecologic Oncology

## 2022-09-20 DIAGNOSIS — N838 Other noninflammatory disorders of ovary, fallopian tube and broad ligament: Secondary | ICD-10-CM

## 2022-09-20 NOTE — Telephone Encounter (Signed)
Called patient to let her know about a sooner opening for surgery on February 27.  Patient is agreeable to move her surgery up into the spot from March 5.  She has a preop appointment along with CT scan this Friday.  No needs or concerns voiced.

## 2022-09-22 ENCOUNTER — Telehealth: Payer: Self-pay | Admitting: Psychiatry

## 2022-09-22 NOTE — Telephone Encounter (Signed)
Called pt to see if she would be amenable to moving her OR date back to 3/5. Pt is agreeable to this. Thanked pt for her willingness. All questions answered.

## 2022-09-23 NOTE — Patient Instructions (Addendum)
SURGICAL WAITING ROOM VISITATION Patients having surgery or a procedure may have no more than 2 support people in the waiting area - these visitors may rotate in the visitor waiting room.   Due to an increase in RSV and influenza rates and associated hospitalizations, children ages 54 and under may not visit patients in [place]. If the patient needs to stay at the hospital during part of their recovery, the visitor guidelines for inpatient rooms apply.  PRE-OP VISITATION  Pre-op nurse will coordinate an appropriate time for 1 support person to accompany the patient in pre-op.  This support person may not rotate.  This visitor will be contacted when the time is appropriate for the visitor to come back in the pre-op area.  Please refer to the [facility] website for the visitor guidelines for Inpatients (after your surgery is over and you are in a regular room).  You are not required to quarantine at this time prior to your surgery. However, you must do this: Hand Hygiene often Do NOT share personal items Notify your provider if you are in close contact with someone who has COVID or you develop fever 100.4 or greater, new onset of sneezing, cough, sore throat, shortness of breath or body aches.  If you test positive for Covid or have been in contact with anyone that has tested positive in the last 10 days please notify you surgeon.    Your procedure is scheduled on:  Tuesday  October 05, 2022  Report to [facility] Main Entrance: [place] entrance where the [employer] is available.   Report to admitting at: 05:15    AM  +++++Call this number if you have any questions or problems the morning of surgery [phone]  Do not eat food after Midnight the night prior to your surgery/procedure.  After Midnight you may have the following liquids until   04:30  AM DAY OF SURGERY  Clear Liquid Diet Water Black Coffee (sugar ok, NO MILK/CREAM OR CREAMERS)  Tea (sugar ok, NO  MILK/CREAM OR CREAMERS) regular and decaf                             Plain Jell-O  with no fruit (NO RED)                                           Fruit ices (not with fruit pulp, NO RED)                                     Popsicles (NO RED)                                                                  Juice: apple, WHITE grape, WHITE cranberry Sports drinks like Gatorade or Powerade (NO RED)                FOLLOW BOWEL PREP AND ANY ADDITIONAL PRE OP INSTRUCTIONS YOU RECEIVED FROM YOUR SURGEON'S OFFICE!!! Eat a light diet the day before surgery.  Examples  including soups, broths, toast, yogurt, mashed potatoes.  AVOID GAS PRODUCING FOODS. Things to avoid include carbonated beverages (fizzy beverages, sodas), raw fruits and raw vegetables (uncooked), or beans.       Oral Hygiene is also important to reduce your risk of infection.        Remember - BRUSH YOUR TEETH THE MORNING OF SURGERY WITH YOUR REGULAR TOOTHPASTE  Do NOT smoke after Midnight the night before surgery.  Take ONLY these medicines the morning of surgery with A SIP OF WATER: venlafaxine (Effexor), cetirizine (Zyrtec), methimazole (Tapazole), omeprazole (Prilosec), oxybutin (Ditropan)                  You may not have any metal on your body including hair pins, jewelry, and body piercing  Do not wear make-up, lotions, powders, perfumes or deodorant  Do not wear nail polish including gel and S&S, artificial / acrylic nails, or any other type of covering on natural nails including finger and toenails. If you have artificial nails, gel coating, etc., that needs to be removed by a nail salon, Please have this removed prior to surgery. Not doing so may mean that your surgery could be cancelled or delayed if the Surgeon or anesthesia staff feels like they are unable to monitor you safely.   Do not shave 48 hours prior to surgery to avoid nicks in your skin which may contribute to postoperative infections.   Contacts, Hearing  Aids, dentures or bridgework may not be worn into surgery. DENTURES WILL BE REMOVED PRIOR TO SURGERY PLEASE DO NOT APPLY "Poly grip" OR ADHESIVES!!!  Patients discharged on the day of surgery will not be allowed to drive home.  Someone NEEDS to stay with you for the first 24 hours after anesthesia.  Do not bring your home medications to the hospital. The Pharmacy will dispense medications listed on your medication list to you during your admission in the Hospital.  Special Instructions: Bring a copy of your healthcare power of attorney and living will documents the day of surgery, if you wish to have them scanned into your [facility] Records- EPIC  Please read over the following fact sheets you were given: IF YOU HAVE QUESTIONS ABOUT YOUR PRE-OP INSTRUCTIONS, PLEASE CALL [phone]  ([place])   [facility] - Preparing for Surgery Before surgery, you can play an important role.  Because skin is not sterile, your skin needs to be as free of germs as possible.  You can reduce the number of germs on your skin by washing with CHG (chlorahexidine gluconate) soap before surgery.  CHG is an antiseptic cleaner which kills germs and bonds with the skin to continue killing germs even after washing. Please DO NOT use if you have an allergy to CHG or antibacterial soaps.  If your skin becomes reddened/irritated stop using the CHG and inform your nurse when you arrive at Short Stay. Do not shave (including legs and underarms) for at least 48 hours prior to the first CHG shower.  You may shave your face/neck.  Please follow these instructions carefully:  1.  Shower with CHG Soap the night before surgery and the  morning of surgery.  2.  If you choose to wash your hair, wash your hair first as usual with your normal  shampoo.  3.  After you shampoo, rinse your hair and body thoroughly to remove the shampoo.  4.  Use CHG as you would any other liquid soap.  You can apply chg  directly to the skin and wash.  Gently with a scrungie or clean washcloth.  5.  Apply the CHG Soap to your body ONLY FROM THE NECK DOWN.   Do not use on face/ open                           Wound or open sores. Avoid contact with eyes, ears mouth and genitals (private parts).                       Wash face,  Genitals (private parts) with your normal soap.             6.  Wash thoroughly, paying special attention to the area where your  surgery  will be performed.  7.  Thoroughly rinse your body with warm water from the neck down.  8.  DO NOT shower/wash with your normal soap after using and rinsing off the CHG Soap.            9.  Pat yourself dry with a clean towel.            10.  Wear clean pajamas.            11.  Place clean sheets on your bed the night of your first shower and do not  sleep with pets.  ON THE DAY OF SURGERY : Do not apply any lotions/deodorants the morning of surgery.  Please wear clean clothes to the hospital/surgery center.    FAILURE TO FOLLOW THESE INSTRUCTIONS MAY RESULT IN THE CANCELLATION OF YOUR SURGERY  PATIENT SIGNATURE_________________________________  NURSE SIGNATURE__________________________________  ________________________________________________________________________        WHAT IS A BLOOD TRANSFUSION? Blood Transfusion Information  A transfusion is the replacement of blood or some of its parts. Blood is made up of multiple cells which provide different functions. Red blood cells carry oxygen and are used for blood loss replacement. White blood cells fight against infection. Platelets control bleeding. Plasma helps clot blood. Other blood products are available for specialized needs, such as hemophilia or other clotting disorders. BEFORE THE TRANSFUSION  Who gives blood for transfusions?  Healthy volunteers who are fully evaluated to make sure their blood is safe. This is blood bank blood. Transfusion therapy is the safest it has ever  been in the practice of medicine. Before blood is taken from a donor, a complete history is taken to make sure that person has no history of diseases nor engages in risky social behavior (examples are intravenous drug use or sexual activity with multiple partners). The donor's travel history is screened to minimize risk of transmitting infections, such as malaria. The donated blood is tested for signs of infectious diseases, such as HIV and hepatitis. The blood is then tested to be sure it is compatible with you in order to minimize the chance of a transfusion reaction. If you or a relative donates blood, this is often done in anticipation of surgery and is not appropriate for emergency situations. It takes many days to process the donated blood. RISKS AND COMPLICATIONS Although transfusion therapy is very safe and saves many lives, the main dangers of transfusion include:  Getting an infectious disease. Developing a transfusion reaction. This is an allergic reaction to something in the blood you were given. Every precaution is taken to prevent this. The decision  to have a blood transfusion has been considered carefully by your caregiver before blood is given. Blood is not given unless the benefits outweigh the risks. AFTER THE TRANSFUSION Right after receiving a blood transfusion, you will usually feel much better and more energetic. This is especially true if your red blood cells have gotten low (anemic). The transfusion raises the level of the red blood cells which carry oxygen, and this usually causes an energy increase. The nurse administering the transfusion will monitor you carefully for complications. HOME CARE INSTRUCTIONS  No special instructions are needed after a transfusion. You may find your energy is better. Speak with your caregiver about any limitations on activity for underlying diseases you may have. SEEK MEDICAL CARE IF:  Your condition is not improving after your transfusion. You  develop redness or irritation at the intravenous (IV) site. SEEK IMMEDIATE MEDICAL CARE IF:  Any of the following symptoms occur over the next 12 hours: Shaking chills. You have a temperature by mouth above 102 F (38.9 C), not controlled by medicine. Chest, back, or muscle pain. People around you feel you are not acting correctly or are confused. Shortness of breath or difficulty breathing. Dizziness and fainting. You get a rash or develop hives. You have a decrease in urine output. Your urine turns a dark color or changes to pink, red, or brown. Any of the following symptoms occur over the next 10 days: You have a temperature by mouth above 102 F (38.9 C), not controlled by medicine. Shortness of breath. Weakness after normal activity. The white part of the eye turns yellow (jaundice). You have a decrease in the amount of urine or are urinating less often. Your urine turns a dark color or changes to pink, red, or brown. Document Released: 07/16/2000 Document Revised: 10/11/2011 Document Reviewed: 03/04/2008 [facility] Patient Information 2014 [place], [place].  _______________________________________________________________________

## 2022-09-23 NOTE — Progress Notes (Addendum)
COVID Vaccine received:  '[x]'$  No '[]'$  Yes Date of any COVID positive Test in last 90 days: None  PCP - Bayard Hugger, NP Cardiologist - none  Chest x-ray -  EKG - 02-19-2020  will repeat at PST   Stress Test -  ECHO -  Cardiac Cath -   PCR screen: '[]'$  Ordered & Completed                      '[]'$   No Order but Needs PROFEND                      '[x]'$   N/A for this surgery  Surgery Plan:  '[x]'$  Ambulatory                            '[]'$  Outpatient in bed                            '[]'$  Admit  Anesthesia:    '[x]'$  General  '[]'$  Spinal                           '[]'$   Choice '[]'$   MAC  Bowel Prep - '[]'$  No  '[]'$   Yes _____GYN Preop diet day before surgery__  Pacemaker / ICD device '[x]'$  No '[]'$  Yes        Device order form faxed '[x]'$  No    '[]'$   Yes      Faxed to:  Spinal Cord Stimulator:'[x]'$  No '[]'$  Yes      (Remind patient to bring remote DOS) Other Implants:   History of Sleep Apnea? '[x]'$  No '[]'$  Yes   CPAP used?- '[x]'$  No '[]'$  Yes    Does the patient monitor blood sugar? '[]'$  No '[]'$  Yes  '[x]'$  N/A  Blood Thinner / Instructions:  none Aspirin Instructions:  none  ERAS Protocol Ordered: '[]'$  No  '[x]'$  Yes '[x]'$  No Drink Ordered Patient is to be NPO after: 05:15 am  Activity level: Patient can climb a flight of stairs without difficulty; '[x]'$  No CP  but would have SOB. Patient can perform ADLs without assistance.   Anesthesia review: HTN, Anxiety, asthma, migraines, GERD, Smoker,  S/P cervical spine surgery by Dr. Creig Hines in [place] (pt doesn't know if it was an ACDF or what levels but her incision was anterior)  Patient denies shortness of breath, fever, cough and chest pain at PAT appointment.  Patient verbalized understanding and agreement to the Pre-Surgical Instructions that were given to them at this PAT appointment. Patient was also educated of the need to review these PAT instructions again prior to his/her surgery.I reviewed the appropriate phone numbers to call if they have any and questions or  concerns.

## 2022-09-24 ENCOUNTER — Encounter (HOSPITAL_COMMUNITY)
Admission: RE | Admit: 2022-09-24 | Discharge: 2022-09-24 | Disposition: A | Discharge status: Home / Self Care | Payer: 59 | Source: Ambulatory Visit | Attending: Psychiatry | Admitting: Psychiatry

## 2022-09-24 ENCOUNTER — Encounter (HOSPITAL_COMMUNITY): Payer: Self-pay

## 2022-09-24 ENCOUNTER — Other Ambulatory Visit: Payer: Self-pay

## 2022-09-24 ENCOUNTER — Ambulatory Visit (HOSPITAL_COMMUNITY)
Admission: RE | Admit: 2022-09-24 | Discharge: 2022-09-24 | Disposition: A | Discharge status: Home / Self Care | Payer: 59 | Source: Ambulatory Visit | Attending: Psychiatry | Admitting: Psychiatry

## 2022-09-24 VITALS — BP 144/79 | HR 67 | Temp 98.2°F | Resp 16 | Ht 63.0 in | Wt 229.0 lb

## 2022-09-24 DIAGNOSIS — N838 Other noninflammatory disorders of ovary, fallopian tube and broad ligament: Secondary | ICD-10-CM | POA: Diagnosis not present

## 2022-09-24 DIAGNOSIS — I10 Essential (primary) hypertension: Secondary | ICD-10-CM

## 2022-09-24 HISTORY — DX: Pneumonia, unspecified organism: J18.9

## 2022-09-24 LAB — TYPE AND SCREEN
ABO/RH(D): O POS
Antibody Screen: NEGATIVE

## 2022-09-24 LAB — CBC
HCT: 38 % (ref 36.0–46.0)
Hemoglobin: 12.2 g/dL (ref 12.0–15.0)
MCH: 29 pg (ref 26.0–34.0)
MCHC: 32.1 g/dL (ref 30.0–36.0)
MCV: 90.3 fL (ref 80.0–100.0)
Platelets: 316 10*3/uL (ref 150–400)
RBC: 4.21 MIL/uL (ref 3.87–5.11)
RDW: 15.2 % (ref 11.5–15.5)
WBC: 7.9 10*3/uL (ref 4.0–10.5)
nRBC: 0 % (ref 0.0–0.2)

## 2022-09-24 LAB — COMPREHENSIVE METABOLIC PANEL
ALT: 24 U/L (ref 0–44)
AST: 21 U/L (ref 15–41)
Albumin: 3.9 g/dL (ref 3.5–5.0)
Alkaline Phosphatase: 99 U/L (ref 38–126)
Anion gap: 7 (ref 5–15)
BUN: 14 mg/dL (ref 6–20)
CO2: 27 mmol/L (ref 22–32)
Calcium: 9.5 mg/dL (ref 8.9–10.3)
Chloride: 100 mmol/L (ref 98–111)
Creatinine, Ser: 0.89 mg/dL (ref 0.44–1.00)
GFR, Estimated: >60 mL/min (ref >60)
Glucose, Bld: 96 mg/dL (ref 70–99)
Potassium: 3.7 mmol/L (ref 3.5–5.1)
Sodium: 134 mmol/L — ABNORMAL LOW (ref 135–145)
Total Bilirubin: 0.4 mg/dL (ref 0.3–1.2)
Total Protein: 7 g/dL (ref 6.5–8.1)

## 2022-09-24 MED ORDER — IOHEXOL 300 MG/ML  SOLN
100.0000 mL | Freq: Once | INTRAMUSCULAR | Status: AC | PRN
  Administered 2022-09-24: 100 mL via INTRAVENOUS

## 2022-09-24 MED ORDER — IOHEXOL 9 MG/ML PO SOLN
500.0000 mL | ORAL | Status: AC
  Administered 2022-09-24: 500 mL via ORAL
  Administered 2022-09-24: 1000 mL via ORAL

## 2022-09-24 MED ORDER — IOHEXOL 9 MG/ML PO SOLN
ORAL | Status: AC
  Filled 2022-09-24: qty 1000

## 2022-09-28 DIAGNOSIS — N838 Other noninflammatory disorders of ovary, fallopian tube and broad ligament: Secondary | ICD-10-CM

## 2022-10-04 ENCOUNTER — Telehealth: Payer: Self-pay

## 2022-10-04 NOTE — Telephone Encounter (Signed)
Telephone call to check on pre-operative status.  Patient compliant with pre-operative instructions.  Reinforced nothing to eat after midnight. Clear liquids until 6:45. Patient to arrive at 7:45 for surgery at 9:45.  No questions or concerns voiced.  Instructed to call for any needs.

## 2022-10-05 ENCOUNTER — Ambulatory Visit (HOSPITAL_COMMUNITY)
Admission: RE | Admit: 2022-10-05 | Discharge: 2022-10-05 | Disposition: A | Discharge status: Home / Self Care | Payer: 59 | Source: Ambulatory Visit | Attending: Psychiatry | Admitting: Psychiatry

## 2022-10-05 ENCOUNTER — Other Ambulatory Visit: Payer: Self-pay

## 2022-10-05 ENCOUNTER — Ambulatory Visit (HOSPITAL_BASED_OUTPATIENT_CLINIC_OR_DEPARTMENT_OTHER): Payer: 59 | Admitting: Anesthesiology

## 2022-10-05 ENCOUNTER — Ambulatory Visit (HOSPITAL_COMMUNITY): Payer: 59 | Admitting: Anesthesiology

## 2022-10-05 ENCOUNTER — Encounter (HOSPITAL_COMMUNITY)
Admission: RE | Disposition: A | Discharge status: Home / Self Care | Payer: Self-pay | Source: Ambulatory Visit | Attending: Psychiatry

## 2022-10-05 ENCOUNTER — Encounter (HOSPITAL_COMMUNITY): Payer: Self-pay | Admitting: Psychiatry

## 2022-10-05 DIAGNOSIS — D270 Benign neoplasm of right ovary: Secondary | ICD-10-CM | POA: Diagnosis not present

## 2022-10-05 DIAGNOSIS — M1990 Unspecified osteoarthritis, unspecified site: Secondary | ICD-10-CM | POA: Diagnosis not present

## 2022-10-05 DIAGNOSIS — N803 Endometriosis of pelvic peritoneum, unspecified: Secondary | ICD-10-CM | POA: Insufficient documentation

## 2022-10-05 DIAGNOSIS — K660 Peritoneal adhesions (postprocedural) (postinfection): Secondary | ICD-10-CM | POA: Diagnosis not present

## 2022-10-05 DIAGNOSIS — F418 Other specified anxiety disorders: Secondary | ICD-10-CM | POA: Diagnosis not present

## 2022-10-05 DIAGNOSIS — F17210 Nicotine dependence, cigarettes, uncomplicated: Secondary | ICD-10-CM | POA: Diagnosis not present

## 2022-10-05 DIAGNOSIS — N838 Other noninflammatory disorders of ovary, fallopian tube and broad ligament: Secondary | ICD-10-CM

## 2022-10-05 DIAGNOSIS — Z90710 Acquired absence of both cervix and uterus: Secondary | ICD-10-CM | POA: Diagnosis not present

## 2022-10-05 DIAGNOSIS — D4959 Neoplasm of unspecified behavior of other genitourinary organ: Secondary | ICD-10-CM

## 2022-10-05 DIAGNOSIS — K219 Gastro-esophageal reflux disease without esophagitis: Secondary | ICD-10-CM | POA: Diagnosis not present

## 2022-10-05 DIAGNOSIS — N80202 Endometriosis of left fallopian tube, unspecified depth: Secondary | ICD-10-CM | POA: Insufficient documentation

## 2022-10-05 DIAGNOSIS — D271 Benign neoplasm of left ovary: Secondary | ICD-10-CM | POA: Insufficient documentation

## 2022-10-05 DIAGNOSIS — I10 Essential (primary) hypertension: Secondary | ICD-10-CM | POA: Diagnosis not present

## 2022-10-05 DIAGNOSIS — N809 Endometriosis, unspecified: Secondary | ICD-10-CM

## 2022-10-05 HISTORY — PX: ROBOTIC ASSISTED BILATERAL SALPINGO OOPHERECTOMY: SHX6078

## 2022-10-05 HISTORY — DX: Other noninflammatory disorders of ovary, fallopian tube and broad ligament: N83.8

## 2022-10-05 SURGERY — SALPINGO-OOPHORECTOMY, BILATERAL, ROBOT-ASSISTED
Anesthesia: General | Laterality: Bilateral

## 2022-10-05 MED ORDER — KETOROLAC TROMETHAMINE 15 MG/ML IJ SOLN: 15.0000 mg | INTRAMUSCULAR | Status: DC

## 2022-10-05 MED ORDER — MEPERIDINE HCL 50 MG/ML IJ SOLN: 6.2500 mg | INTRAMUSCULAR | Status: DC | PRN

## 2022-10-05 MED ORDER — BUPIVACAINE HCL 0.25 % IJ SOLN
INTRAMUSCULAR | Status: DC | PRN
  Administered 2022-10-05: 50 mL

## 2022-10-05 MED ORDER — BUPIVACAINE HCL 0.25 % IJ SOLN
INTRAMUSCULAR | Status: AC
  Filled 2022-10-05: qty 1

## 2022-10-05 MED ORDER — ROCURONIUM BROMIDE 100 MG/10ML IV SOLN
INTRAVENOUS | Status: DC | PRN
  Administered 2022-10-05: 10 mg via INTRAVENOUS
  Administered 2022-10-05: 60 mg via INTRAVENOUS

## 2022-10-05 MED ORDER — PROPOFOL 10 MG/ML IV BOLUS
INTRAVENOUS | Status: DC | PRN
  Administered 2022-10-05: 200 mg via INTRAVENOUS

## 2022-10-05 MED ORDER — LIDOCAINE HCL (CARDIAC) PF 100 MG/5ML IV SOSY
PREFILLED_SYRINGE | INTRAVENOUS | Status: DC | PRN
  Administered 2022-10-05: 60 mg via INTRAVENOUS

## 2022-10-05 MED ORDER — MIDAZOLAM HCL 5 MG/5ML IJ SOLN
INTRAMUSCULAR | Status: DC | PRN
  Administered 2022-10-05: 2 mg via INTRAVENOUS

## 2022-10-05 MED ORDER — HEPARIN SODIUM (PORCINE) 5000 UNIT/ML IJ SOLN
5000.0000 [IU] | INTRAMUSCULAR | Status: AC
  Administered 2022-10-05: 5000 [IU] via SUBCUTANEOUS
  Filled 2022-10-05: qty 1

## 2022-10-05 MED ORDER — FENTANYL CITRATE (PF) 100 MCG/2ML IJ SOLN
INTRAMUSCULAR | Status: AC
  Filled 2022-10-05: qty 2

## 2022-10-05 MED ORDER — PROPOFOL 10 MG/ML IV BOLUS
INTRAVENOUS | Status: AC
  Filled 2022-10-05: qty 20

## 2022-10-05 MED ORDER — OXYCODONE HCL 5 MG PO TABS
ORAL_TABLET | ORAL | Status: AC
  Filled 2022-10-05: qty 1

## 2022-10-05 MED ORDER — ROCURONIUM BROMIDE 10 MG/ML (PF) SYRINGE
PREFILLED_SYRINGE | INTRAVENOUS | Status: AC
  Filled 2022-10-05: qty 10

## 2022-10-05 MED ORDER — ACETAMINOPHEN 160 MG/5ML PO SOLN: 325.0000 mg | ORAL | Status: DC | PRN

## 2022-10-05 MED ORDER — ONDANSETRON HCL 4 MG/2ML IJ SOLN
INTRAMUSCULAR | Status: AC
  Filled 2022-10-05: qty 2

## 2022-10-05 MED ORDER — LIDOCAINE HCL (PF) 2 % IJ SOLN
INTRAMUSCULAR | Status: AC
  Filled 2022-10-05: qty 5

## 2022-10-05 MED ORDER — LACTATED RINGERS IR SOLN
Status: DC | PRN
  Administered 2022-10-05: 1000 mL

## 2022-10-05 MED ORDER — FENTANYL CITRATE PF 50 MCG/ML IJ SOSY
25.0000 ug | PREFILLED_SYRINGE | INTRAMUSCULAR | Status: DC | PRN
  Administered 2022-10-05 (×2): 50 ug via INTRAVENOUS

## 2022-10-05 MED ORDER — FENTANYL CITRATE PF 50 MCG/ML IJ SOSY
PREFILLED_SYRINGE | INTRAMUSCULAR | Status: AC
  Filled 2022-10-05: qty 2

## 2022-10-05 MED ORDER — LACTATED RINGERS IV SOLN: INTRAVENOUS | Status: DC

## 2022-10-05 MED ORDER — MIDAZOLAM HCL 2 MG/2ML IJ SOLN
INTRAMUSCULAR | Status: AC
  Filled 2022-10-05: qty 2

## 2022-10-05 MED ORDER — FENTANYL CITRATE (PF) 100 MCG/2ML IJ SOLN
INTRAMUSCULAR | Status: DC | PRN
  Administered 2022-10-05 (×4): 50 ug via INTRAVENOUS

## 2022-10-05 MED ORDER — ONDANSETRON HCL 4 MG/2ML IJ SOLN: 4.0000 mg | Freq: Once | INTRAMUSCULAR | Status: DC | PRN

## 2022-10-05 MED ORDER — STERILE WATER FOR IRRIGATION IR SOLN
Status: DC | PRN
  Administered 2022-10-05: 1000 mL

## 2022-10-05 MED ORDER — BUPIVACAINE LIPOSOME 1.3 % IJ SUSP
INTRAMUSCULAR | Status: DC | PRN
  Administered 2022-10-05: 20 mL

## 2022-10-05 MED ORDER — SUGAMMADEX SODIUM 200 MG/2ML IV SOLN
INTRAVENOUS | Status: DC | PRN
  Administered 2022-10-05: 200 mg via INTRAVENOUS

## 2022-10-05 MED ORDER — ACETAMINOPHEN 325 MG PO TABS
325.0000 mg | ORAL_TABLET | ORAL | Status: DC | PRN
  Administered 2022-10-05: 650 mg via ORAL

## 2022-10-05 MED ORDER — SCOPOLAMINE 1 MG/3DAYS TD PT72
1.0000 | MEDICATED_PATCH | TRANSDERMAL | Status: DC
  Administered 2022-10-05: 1.5 mg via TRANSDERMAL
  Filled 2022-10-05: qty 1

## 2022-10-05 MED ORDER — GABAPENTIN 300 MG PO CAPS
300.0000 mg | ORAL_CAPSULE | ORAL | Status: AC
  Administered 2022-10-05: 300 mg via ORAL
  Filled 2022-10-05: qty 1

## 2022-10-05 MED ORDER — OXYCODONE HCL 5 MG PO TABS
5.0000 mg | ORAL_TABLET | Freq: Once | ORAL | Status: AC | PRN
  Administered 2022-10-05: 5 mg via ORAL

## 2022-10-05 MED ORDER — DEXAMETHASONE SODIUM PHOSPHATE 10 MG/ML IJ SOLN
INTRAMUSCULAR | Status: AC
  Filled 2022-10-05: qty 1

## 2022-10-05 MED ORDER — OXYCODONE HCL 5 MG/5ML PO SOLN: 5.0000 mg | Freq: Once | ORAL | Status: AC | PRN

## 2022-10-05 MED ORDER — ORAL CARE MOUTH RINSE: 15.0000 mL | Freq: Once | OROMUCOSAL | Status: AC

## 2022-10-05 MED ORDER — ONDANSETRON HCL 4 MG/2ML IJ SOLN
INTRAMUSCULAR | Status: DC | PRN
  Administered 2022-10-05: 4 mg via INTRAVENOUS

## 2022-10-05 MED ORDER — KETAMINE HCL 10 MG/ML IJ SOLN
INTRAMUSCULAR | Status: DC | PRN
  Administered 2022-10-05: 50 mg via INTRAVENOUS

## 2022-10-05 MED ORDER — ACETAMINOPHEN 500 MG PO TABS
1000.0000 mg | ORAL_TABLET | ORAL | Status: AC
  Administered 2022-10-05: 1000 mg via ORAL
  Filled 2022-10-05: qty 2

## 2022-10-05 MED ORDER — ACETAMINOPHEN 325 MG PO TABS
ORAL_TABLET | ORAL | Status: AC
  Filled 2022-10-05: qty 2

## 2022-10-05 MED ORDER — KETAMINE HCL 50 MG/5ML IJ SOSY
PREFILLED_SYRINGE | INTRAMUSCULAR | Status: AC
  Filled 2022-10-05: qty 5

## 2022-10-05 MED ORDER — DEXAMETHASONE SODIUM PHOSPHATE 4 MG/ML IJ SOLN
4.0000 mg | INTRAMUSCULAR | Status: AC
  Administered 2022-10-05: 10 mg via INTRAVENOUS

## 2022-10-05 MED ORDER — BUPIVACAINE LIPOSOME 1.3 % IJ SUSP
INTRAMUSCULAR | Status: AC
  Filled 2022-10-05: qty 20

## 2022-10-05 MED ORDER — CHLORHEXIDINE GLUCONATE 0.12 % MT SOLN
15.0000 mL | Freq: Once | OROMUCOSAL | Status: AC
  Administered 2022-10-05: 15 mL via OROMUCOSAL

## 2022-10-05 SURGICAL SUPPLY — 83 items
ADH SKN CLS APL DERMABOND .7 (GAUZE/BANDAGES/DRESSINGS) ×2
AGENT HMST KT MTR STRL THRMB (HEMOSTASIS)
APL ESCP 34 STRL LF DISP (HEMOSTASIS)
APPLICATOR SURGIFLO ENDO (HEMOSTASIS) IMPLANT
BAG LAPAROSCOPIC 12 15 PORT 16 (BASKET) IMPLANT
BAG RETRIEVAL 12/15 (BASKET)
BLADE SURG SZ10 CARB STEEL (BLADE) IMPLANT
COVER BACK TABLE 60X90IN (DRAPES) ×3 IMPLANT
COVER TIP SHEARS 8 DVNC (MISCELLANEOUS) ×3 IMPLANT
COVER TIP SHEARS 8MM DA VINCI (MISCELLANEOUS) ×2
DERMABOND ADVANCED .7 DNX12 (GAUZE/BANDAGES/DRESSINGS) ×3 IMPLANT
DRAPE ARM DVNC X/XI (DISPOSABLE) ×12 IMPLANT
DRAPE COLUMN DVNC XI (DISPOSABLE) ×3 IMPLANT
DRAPE DA VINCI XI ARM (DISPOSABLE) ×8
DRAPE DA VINCI XI COLUMN (DISPOSABLE) ×2
DRAPE SHEET LG 3/4 BI-LAMINATE (DRAPES) ×3 IMPLANT
DRAPE SURG IRRIG POUCH 19X23 (DRAPES) ×3 IMPLANT
DRSG OPSITE POSTOP 4X6 (GAUZE/BANDAGES/DRESSINGS) IMPLANT
DRSG OPSITE POSTOP 4X8 (GAUZE/BANDAGES/DRESSINGS) IMPLANT
ELECT PENCIL ROCKER SW 15FT (MISCELLANEOUS) IMPLANT
ELECT REM PT RETURN 15FT ADLT (MISCELLANEOUS) ×3 IMPLANT
GAUZE 4X4 16PLY ~~LOC~~+RFID DBL (SPONGE) ×4 IMPLANT
GLOVE BIO SURGEON STRL SZ 6 (GLOVE) ×12 IMPLANT
GLOVE BIO SURGEON STRL SZ 6.5 (GLOVE) ×3 IMPLANT
GLOVE BIOGEL PI IND STRL 6.5 (GLOVE) ×6 IMPLANT
GOWN STRL REUS W/ TWL LRG LVL3 (GOWN DISPOSABLE) ×12 IMPLANT
GOWN STRL REUS W/TWL LRG LVL3 (GOWN DISPOSABLE) ×8
GRASPER SUT TROCAR 14GX15 (MISCELLANEOUS) ×1 IMPLANT
HOLDER FOLEY CATH W/STRAP (MISCELLANEOUS) IMPLANT
IRRIG SUCT STRYKERFLOW 2 WTIP (MISCELLANEOUS) ×2
IRRIGATION SUCT STRKRFLW 2 WTP (MISCELLANEOUS) ×3 IMPLANT
KIT PROCEDURE DA VINCI SI (MISCELLANEOUS)
KIT PROCEDURE DVNC SI (MISCELLANEOUS) IMPLANT
KIT TURNOVER KIT A (KITS) IMPLANT
LIGASURE IMPACT 36 18CM CVD LR (INSTRUMENTS) IMPLANT
MANIPULATOR ADVINCU DEL 3.0 PL (MISCELLANEOUS) IMPLANT
MANIPULATOR ADVINCU DEL 3.5 PL (MISCELLANEOUS) IMPLANT
MANIPULATOR UTERINE 4.5 ZUMI (MISCELLANEOUS) IMPLANT
NDL HYPO 21X1.5 SAFETY (NEEDLE) ×2 IMPLANT
NDL INSUFFLATION 14GA 120MM (NEEDLE) IMPLANT
NDL SPNL 18GX3.5 QUINCKE PK (NEEDLE) IMPLANT
NEEDLE HYPO 21X1.5 SAFETY (NEEDLE) ×2 IMPLANT
NEEDLE INSUFFLATION 14GA 120MM (NEEDLE) IMPLANT
NEEDLE SPNL 18GX3.5 QUINCKE PK (NEEDLE) IMPLANT
OBTURATOR OPTICAL STANDARD 8MM (TROCAR) ×2
OBTURATOR OPTICAL STND 8 DVNC (TROCAR) ×2
OBTURATOR OPTICALSTD 8 DVNC (TROCAR) ×3 IMPLANT
PACK ROBOT GYN CUSTOM WL (TRAY / TRAY PROCEDURE) ×3 IMPLANT
PAD ARMBOARD 7.5X6 YLW CONV (MISCELLANEOUS) ×3 IMPLANT
PAD POSITIONING PINK XL (MISCELLANEOUS) ×3 IMPLANT
PORT ACCESS TROCAR AIRSEAL 12 (TROCAR) ×2 IMPLANT
SCRUB CHG 4% DYNA-HEX 4OZ (MISCELLANEOUS) ×6 IMPLANT
SEAL CANN UNIV 5-8 DVNC XI (MISCELLANEOUS) ×12 IMPLANT
SEAL XI 5MM-8MM UNIVERSAL (MISCELLANEOUS) ×8
SET TRI-LUMEN FLTR TB AIRSEAL (TUBING) ×4 IMPLANT
SPIKE FLUID TRANSFER (MISCELLANEOUS) ×3 IMPLANT
SPONGE T-LAP 18X18 ~~LOC~~+RFID (SPONGE) IMPLANT
SURGIFLO W/THROMBIN 8M KIT (HEMOSTASIS) IMPLANT
SUT MNCRL AB 4-0 PS2 18 (SUTURE) IMPLANT
SUT MON AB 2-0 SH 27 (SUTURE) ×2
SUT MON AB 2-0 SH27 (SUTURE) ×1 IMPLANT
SUT PDS AB 1 TP1 54 (SUTURE) IMPLANT
SUT VIC AB 0 CT1 27 (SUTURE)
SUT VIC AB 0 CT1 27XBRD ANTBC (SUTURE) IMPLANT
SUT VIC AB 2-0 CT1 27 (SUTURE)
SUT VIC AB 2-0 CT1 TAPERPNT 27 (SUTURE) IMPLANT
SUT VIC AB 4-0 PS2 18 (SUTURE) ×6 IMPLANT
SUT VICRYL 0 UR6 27IN ABS (SUTURE) ×1 IMPLANT
SUT VLOC 180 0 9IN  GS21 (SUTURE)
SUT VLOC 180 0 9IN GS21 (SUTURE) IMPLANT
SYR 10ML LL (SYRINGE) IMPLANT
SYS BAG RETRIEVAL 10MM (BASKET) ×4
SYS WOUND ALEXIS 18CM MED (MISCELLANEOUS)
SYSTEM BAG RETRIEVAL 10MM (BASKET) ×2 IMPLANT
SYSTEM WOUND ALEXIS 18CM MED (MISCELLANEOUS) IMPLANT
TOWEL OR NON WOVEN STRL DISP B (DISPOSABLE) IMPLANT
TRAP SPECIMEN MUCUS 40CC (MISCELLANEOUS) IMPLANT
TRAY FOLEY MTR SLVR 16FR STAT (SET/KITS/TRAYS/PACK) ×3 IMPLANT
TROCAR ADV FIXATION 12X100MM (TROCAR) ×1 IMPLANT
TROCAR PORT AIRSEAL 5X120 (TROCAR) IMPLANT
UNDERPAD 30X36 HEAVY ABSORB (UNDERPADS AND DIAPERS) ×6 IMPLANT
WATER STERILE IRR 1000ML POUR (IV SOLUTION) ×3 IMPLANT
YANKAUER SUCT BULB TIP 10FT TU (MISCELLANEOUS) IMPLANT

## 2022-10-05 NOTE — Discharge Instructions (Addendum)
AFTER SURGERY INSTRUCTIONS   Return to work: 4-6 weeks if applicable   Activity: 1. Be up and out of the bed during the day.  Take a nap if needed.  You may walk up steps but be careful and use the hand rail.  Stair climbing will tire you more than you think, you may need to stop part way and rest.    2. No lifting or straining for 6 weeks over 10 pounds. No pushing, pulling, straining for 6 weeks.   3. No driving for around 1 week(s).  Do not drive if you are taking narcotic pain medicine and make sure that your reaction time has returned.    4. You can shower as soon as the next day after surgery. Shower daily.  Use your regular soap and water (not directly on the incision) and pat your incision(s) dry afterwards; don't rub.  No tub baths or submerging your body in water until cleared by your surgeon. If you have the soap that was given to you by pre-surgical testing that was used before surgery, you do not need to use it afterwards because this can irritate your incisions.    5. No sexual activity and nothing in the vagina for 4 weeks.   6. You may experience a small amount of clear drainage from your incisions, which is normal.  If the drainage persists, increases, or changes color please call the office.   7. Do not use creams, lotions, or ointments such as neosporin on your incisions after surgery until advised by your surgeon because they can cause removal of the dermabond glue on your incisions.     8. Take Tylenol or ibuprofen (or voltaren, do not take together) first for pain if you are able to take these medications and only use narcotic pain medication for severe pain not relieved by the Tylenol or Ibuprofen.  Monitor your Tylenol intake to a max of 4,000 mg in a 24 hour period.    Diet: 1. Low sodium Heart Healthy Diet is recommended but you are cleared to resume your normal (before surgery) diet after your procedure.   2. It is safe to use a laxative, such as Miralax or Colace,  if you have difficulty moving your bowels. You have been prescribed Sennakot-S to take at bedtime every evening after surgery to keep bowel movements regular and to prevent constipation.     Wound Care: 1. Keep clean and dry.  Shower daily.   Reasons to call the Doctor: Fever - Oral temperature greater than 100.4 degrees Fahrenheit Foul-smelling vaginal discharge Difficulty urinating Nausea and vomiting Increased pain at the site of the incision that is unrelieved with pain medicine. Difficulty breathing with or without chest pain New calf pain especially if only on one side Sudden, continuing increased vaginal bleeding with or without clots.   Contacts: For questions or concerns you should contact:   Dr. Bernadene Bell at [facility], NP at [phone]   After Hours: call [phone] and have the GYN Oncologist paged/contacted (after 5 pm or on the weekends).   Messages sent via mychart are for non-urgent matters and are not responded to after hours so for urgent needs, please call the after hours number.

## 2022-10-05 NOTE — Transfer of Care (Signed)
Immediate Anesthesia Transfer of Care Note  Patient: Lynn Newman  Procedure(s) Performed: XI ROBOTIC ASSISTED BILATERAL SALPINGO OOPHORECTOMY, WITH PERITONEAL BIOPSIES (Bilateral)  Patient Location: PACU  Anesthesia Type:General  Level of Consciousness: awake, alert , oriented, and patient cooperative  Airway & Oxygen Therapy: Patient Spontanous Breathing and Patient connected to nasal cannula oxygen  Post-op Assessment: Report given to RN, Post -op Vital signs reviewed and stable, and Patient moving all extremities  Post vital signs: Reviewed and stable  Last Vitals:  Vitals Value Taken Time  BP 162/81 10/05/22 1251  Temp 36.4 C 10/05/22 1251  Pulse 81 10/05/22 1254  Resp 16 10/05/22 1254  SpO2 100 % 10/05/22 1254  Vitals shown include unvalidated device data.  Last Pain:  Vitals:   10/05/22 1251  TempSrc:   PainSc: Asleep         Complications: No notable events documented.

## 2022-10-05 NOTE — Anesthesia Postprocedure Evaluation (Signed)
Anesthesia Post Note  Patient: Lynn Newman  Procedure(s) Performed: XI ROBOTIC ASSISTED BILATERAL SALPINGO OOPHORECTOMY, WITH PERITONEAL BIOPSIES (Bilateral)     Patient location during evaluation: PACU Anesthesia Type: General Level of consciousness: awake and alert Pain management: pain level controlled Vital Signs Assessment: post-procedure vital signs reviewed and stable Respiratory status: spontaneous breathing, nonlabored ventilation, respiratory function stable and patient connected to nasal cannula oxygen Cardiovascular status: blood pressure returned to baseline and stable Postop Assessment: no apparent nausea or vomiting Anesthetic complications: no   No notable events documented.  Last Vitals:  Vitals:   10/05/22 1415 10/05/22 1431  BP: 106/81 139/87  Pulse: (!) 55 88  Resp: (!) 7 16  Temp:  36.7 C  SpO2: 94% 93%    Last Pain:  Vitals:   10/05/22 1431  TempSrc: Oral  PainSc: 7                  Tinie Mcgloin

## 2022-10-05 NOTE — Anesthesia Preprocedure Evaluation (Signed)
Anesthesia Evaluation  Patient identified by MRN, date of birth, ID band Patient awake    Reviewed: Allergy & Precautions, H&P , NPO status , Patient's Chart, lab work & pertinent test results  Airway Mallampati: II   Neck ROM: full    Dental  (+) Teeth Intact, Dental Advisory Given   Pulmonary asthma , Current Smoker and Patient abstained from smoking., former smoker   breath sounds clear to auscultation       Cardiovascular hypertension, Pt. on medications  Rhythm:regular Rate:Normal     Neuro/Psych  Headaches PSYCHIATRIC DISORDERS Anxiety Depression       GI/Hepatic Neg liver ROS,GERD  Medicated and Controlled,,  Endo/Other  Hypothyroidism    Renal/GU negative Renal ROS  negative genitourinary   Musculoskeletal  (+) Arthritis ,    Abdominal   Peds negative pediatric ROS (+)  Hematology negative hematology ROS (+)   Anesthesia Other Findings   Reproductive/Obstetrics negative OB ROS                             Anesthesia Physical Anesthesia Plan  ASA: 3  Anesthesia Plan: General   Post-op Pain Management: Tylenol PO (pre-op)* and Celebrex PO (pre-op)*   Induction: Intravenous  PONV Risk Score and Plan: 2 and Ondansetron, Propofol infusion, Treatment may vary due to age or medical condition and Midazolam  Airway Management Planned: Oral ETT and LMA  Additional Equipment: None  Intra-op Plan:   Post-operative Plan: Extubation in OR  Informed Consent: I have reviewed the patients History and Physical, chart, labs and discussed the procedure including the risks, benefits and alternatives for the proposed anesthesia with the patient or authorized representative who has indicated his/her understanding and acceptance.       Plan Discussed with: CRNA, Anesthesiologist and Surgeon  Anesthesia Plan Comments: (  )        Anesthesia Quick Evaluation

## 2022-10-05 NOTE — Anesthesia Procedure Notes (Signed)
Procedure Name: Intubation Date/Time: 10/05/2022 10:01 AM  Performed by: Randye Lobo, CRNAPre-anesthesia Checklist: Patient identified, Emergency Drugs available, Suction available and Patient being monitored Patient Re-evaluated:Patient Re-evaluated prior to induction Oxygen Delivery Method: Circle System Utilized Preoxygenation: Pre-oxygenation with 100% oxygen Induction Type: IV induction Ventilation: Mask ventilation without difficulty Laryngoscope Size: Mac, 3 and Glidescope Grade View: Grade I Tube type: Oral Tube size: 7.0 mm Number of attempts: 1 Airway Equipment and Method: Stylet and Oral airway Placement Confirmation: ETT inserted through vocal cords under direct vision, positive ETCO2 and breath sounds checked- equal and bilateral Secured at: 22 cm Tube secured with: Tape Dental Injury: Teeth and Oropharynx as per pre-operative assessment

## 2022-10-05 NOTE — Interval H&P Note (Signed)
History and Physical Interval Note:  10/05/2022 9:04 AM  Lynn Newman  has presented today for surgery, with the diagnosis of OVARAIN MASS.  The various methods of treatment have been discussed with the patient and family. After consideration of risks, benefits and other options for treatment, the patient has consented to  Procedure(s): XI ROBOTIC [place] (Bilateral) POSSIBLE MINI LAPAROTOMY WITH   STAGING (N/A) as a surgical intervention.  The patient's history has been reviewed, patient examined, no change in status, stable for surgery.  I have reviewed the patient's chart and labs.  Questions were answered to the patient's satisfaction.     Declin Rajan

## 2022-10-05 NOTE — Op Note (Signed)
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)  Procedures: Robotic-assisted laparoscopic bilateral salpingo-oophorectomy, peritoneal biopsy, fulguration of endometriosis  Surgeon: Bernadene Bell, MD  Assistants: Lahoma Crocker, MD and (an MD assistant was necessary for tissue manipulation, management of robotic instrumentation, retraction and positioning due to the complexity of the case and hospital policies)  Anesthesia: General  Estimated Blood Loss: 10 ml    Fluids: 1000 ml, crystalloid  Urine Output: 700 ml, clear yellow  Findings: On entry to abdomen, small adhesion of omentum to the anterior abdominal wall at the insertion of the falciform ligament. Otherwise normal upper abdominal survey with smooth diaphragm, liver, stomach and normal appearing omentum and bowel. Surgically absent uterus and cervix. Enlarged solid right ovary. Left ovary and fallopian tube with some nodularity and excrescences. Few scattered powder burn lesions along pelvic peritoneal surfaces that appears consistent with endometriosis. 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   Specimens:  ID Type Source Tests Collected by Time Destination  1 : Right Fallopian Tube and Ovary Tissue PATH Soft tissue resection SURGICAL PATHOLOGY Bernadene Bell, MD 10/05/2022 1038   2 : Left Fallopian Tube and Ovary Tissue PATH Soft tissue resection SURGICAL PATHOLOGY Bernadene Bell, MD 10/05/2022 1049   3 : Right Pelvic Peritoneal Biopsy Tissue PATH Soft tissue resection SURGICAL PATHOLOGY Bernadene Bell, MD 10/05/2022 1053   4 : Bladder Peritoneal Biopsy Tissue PATH Soft tissue resection SURGICAL PATHOLOGY Bernadene Bell, MD 10/05/2022 1121   5 : Left Pelvic Sidewall Peritoneal Biopsy and reminent round Tissue PATH Soft  tissue resection SURGICAL PATHOLOGY Bernadene Bell, MD 10/05/2022 1123   A : Pelvic Washings Body Fluid Peritoneal Washings CYTOLOGY - NON PAP Bernadene Bell, MD [id number]     Complications:  None  Indications for Procedure: Lynn Newman is a 57 y.o. woman with enlarged heterogenous right ovary.  Prior to the procedure, all risks, benefits, and alternatives were discussed and informed surgical consent was signed.  Procedure: Patient was taken to the operating room where general anesthesia was achieved.  She was positioned in dorsal lithotomy and prepped and draped.  A foley catheter was inserted into the bladder.  A spongestick was placed into the vagina.  A 12 mm incision was made in the left upper quadrant near Palmer's point.  The abdomen was entered with a 5 mm OptiView trocar under direct visualization.  The abdomen was insufflated, the patient placed in steep Trendelenburg, and additional trocars were placed as follows: an 23m robotic trocar superior to the umbilicus, one 8 mm robotic trocar in the right abdomen, and one 8 mm robotic trocar in the left abdomen.  The left upper quadrant trocar was removed and replaced with a 12 mm airseal trocar.  All trocars were placed under direct visualization.  The bowels were moved into the upper abdomen.  The DaVinci robotic surgical system was brought to the patient's bedside and docked.  The right retroperitoneum entered.  The right ureter was identified.  The right infundibulopelvic ligament was isolated, cauterized, and transected.  The peritoneal incision was extended caudally. Sharp dissection and short bursts of cautery was used to free the adnexa from the right pelvic sidewall.  The same procedure was performed on the contralateral side.  Both specimens were placed into Endo-Catch bags. The smaller left adnexa was removed from the assistant site. Given the size of the right  adnexa, the trocar was removed and the fascial and skins incisions  extended to approximately 3-4cm. The right adnexa in the Endocatch bag was then removed through the left upper quadrant incision.  Three peritoneal biopsies were obtained of possible endometriotic lesions. Additional powder burn lesions were fulgarated with catuery. The pelvis was irrigated and all operative sites were found to be hemostatic. All instruments were removed and the robot was taken from the patient's bedside. The fascia at the left upper quadrant incision was closed with a running stitch of 0 Vicryl. This area was reinspected with the camera and noted to be closed completely with hemostasis. The abdomen was desufflated and all ports were removed. Exparel was infiltrated at the left upper quadrant incision. The subcutaneous tissues here irrigated and made hemostatic and reapproximated with 2-0 vicryl in a running fashion. The skin at all incisions was closed with 4-0 Vicryl to reapproximate the subcutaneous tissue and 4-0 monocryl in a subcuticular fashion followed by surgical glue.  Patient tolerated the procedure well. Sponge, lap, and instrument counts were correct.  No perioperative antibiotic prophylaxis was indicated for this procedure.  She was extubated and taken to the PACU in stable condition.  Bernadene Bell, MD Gynecologic Oncology

## 2022-10-06 ENCOUNTER — Other Ambulatory Visit: Payer: Self-pay | Admitting: Gynecologic Oncology

## 2022-10-06 ENCOUNTER — Encounter (HOSPITAL_COMMUNITY): Payer: Self-pay | Admitting: Psychiatry

## 2022-10-06 ENCOUNTER — Telehealth: Payer: Self-pay

## 2022-10-06 DIAGNOSIS — N838 Other noninflammatory disorders of ovary, fallopian tube and broad ligament: Secondary | ICD-10-CM

## 2022-10-06 MED ORDER — OXYCODONE HCL 5 MG PO TABS: 5.0000 mg | ORAL_TABLET | ORAL | 0 refills | Status: DC | PRN

## 2022-10-06 NOTE — Progress Notes (Signed)
See RN note.

## 2022-10-06 NOTE — Telephone Encounter (Signed)
Spoke with Lynn Newman this morning. She states she is eating, drinking and urinating well. She has not had a BM yet but is passing gas. She has moved her bowels. She is taking senokot as prescribed and encouraged her to drink plenty of water. She denies fever or chills. Incisions are dry and intact. She rates her pain 8/10. She is taking 1 tramadol every six hours alt with Tylenol 500 mg with minimal effect. Reviewed with Lynn Newman. Lynn Newman states that she is not currently using the Voltaren. Lynn Newman said that she can take 2 tramadol every 6 hours prn and alt with Ibuprofen 800 mg q 8 hours. She is to call the office ~1430 to let us know how her pain level is with the 2 tramadol.     Instructed to call office with any fever, chills, purulent drainage, uncontrolled pain or any other questions or concerns. Patient verbalizes understanding.   Pt aware of post op appointments as well as the office number [phone] and after hours number [phone] to call if she has any questions or concerns

## 2022-10-06 NOTE — Telephone Encounter (Signed)
Lynn Newman states that her pain is now a 4/10 with 2 tramadol. She took a Voltaren 7.5 mg tab instead of the ibuprofen ~2 hours after the tramadol.  She is satisfied with her current pain level.  She requested to have more tramadol tabs. She has 5 left. She would like more tablets. [name], NP could not send in more tablets due to interference with her Paxil.  She sent in 10 Oxycodone 5 mg tablets to use  every 4 hours as needed for pain.  She needs to take one tablet the first time to see how it effects her. She is to call back if the medication is not effective for her pain control. Pt verbalized understanding.

## 2022-10-07 LAB — SURGICAL PATHOLOGY

## 2022-10-07 LAB — CYTOLOGY - NON PAP

## 2022-10-14 ENCOUNTER — Other Ambulatory Visit: Payer: Self-pay | Admitting: Family

## 2022-10-14 DIAGNOSIS — Z1231 Encounter for screening mammogram for malignant neoplasm of breast: Secondary | ICD-10-CM

## 2022-10-15 ENCOUNTER — Ambulatory Visit
Admission: RE | Admit: 2022-10-15 | Discharge: 2022-10-15 | Disposition: A | Discharge status: Home / Self Care | Payer: 59 | Source: Ambulatory Visit | Attending: Family | Admitting: Family

## 2022-10-15 DIAGNOSIS — Z1231 Encounter for screening mammogram for malignant neoplasm of breast: Secondary | ICD-10-CM

## 2022-10-25 ENCOUNTER — Inpatient Hospital Stay: Payer: 59 | Attending: Psychiatry | Admitting: Psychiatry

## 2022-10-25 VITALS — BP 140/87 | HR 84 | Temp 97.7°F | Resp 0 | Ht 63.0 in | Wt 224.0 lb

## 2022-10-25 DIAGNOSIS — Z90722 Acquired absence of ovaries, bilateral: Secondary | ICD-10-CM

## 2022-10-25 DIAGNOSIS — D279 Benign neoplasm of unspecified ovary: Secondary | ICD-10-CM

## 2022-10-25 DIAGNOSIS — N809 Endometriosis, unspecified: Secondary | ICD-10-CM

## 2022-10-25 DIAGNOSIS — N838 Other noninflammatory disorders of ovary, fallopian tube and broad ligament: Secondary | ICD-10-CM

## 2022-10-25 NOTE — Patient Instructions (Signed)
It was a pleasure to see you in clinic today. - No restrictions after 4 weeks postop. - Healing well. Okay to return to routine care.   Thank you very much for allowing me to provide care for you today.  I appreciate your confidence in choosing our Gynecologic Oncology team at [facility].  If you have any questions about your visit today please call our office or send [place] a MyChart message and we will get back to you as soon as possible.

## 2022-10-25 NOTE — Progress Notes (Unsigned)
Gynecologic Oncology Return Clinic Visit  Date of Service: 10/25/2022 Referring Provider: Vania Rea, MD  Assessment & Plan: Lynn Newman is a 57 y.o. woman with bilateral ovarian fibromas and endometriosis who is 3 weeks s/p RA-BSO, peritoneal biopsy, fulguration of endometriosis on 10/05/22.  Postop: - Pt recovering well from surgery and healing appropriately postoperatively - Intraoperative findings and pathology results reviewed. - Ongoing postoperative expectations and precautions reviewed. Continue with no lifting >10lbs through 4 weeks postoperatively - Pt has returned to work at daycare. Advised to continue to try to avoid heavy lifting for now, but no restrictions at 4 weeks.  - Okay to return to routine care.  RTC PRN.  Bernadene Bell, MD Gynecologic Oncology   ----------------------- Reason for Visit: Postop  Interval History: Pt reports that she is recovering well from surgery. She is not needing anything for pain. She is eating and drinking well. She is voiding without issue and having regular bowel movements.   She reports that some of the pain that she was having before is gone. Still notes left hip pain but this is same as before.    Past Medical/Surgical History: Past Medical History:  Diagnosis Date   Anxiety    Arthritis    Arthritis    Asthma    Depression    GERD (gastroesophageal reflux disease)    Headache    migraines   Hypertension    Hyperthyroidism    Ovarian mass    Pneumonia     Past Surgical History:  Procedure Laterality Date   ABDOMINAL HYSTERECTOMY     CERVICAL SPINE SURGERY  2023   CESAREAN SECTION     CHOLECYSTECTOMY     FINGER GANGLION CYST EXCISION     ROBOTIC ASSISTED BILATERAL SALPINGO OOPHERECTOMY Bilateral 10/05/2022   Procedure: XI ROBOTIC ASSISTED BILATERAL SALPINGO OOPHORECTOMY, WITH PERITONEAL BIOPSIES;  Surgeon: Bernadene Bell, MD;  Location: [facility];  Service: Gynecology;  Laterality: Bilateral;   TONSILLECTOMY      TOTAL HIP ARTHROPLASTY Right 08/11/2017   Procedure: RIGHT TOTAL HIP ARTHROPLASTY ANTERIOR APPROACH;  Surgeon: Rod Can, MD;  Location: [facility];  Service: Orthopedics;  Laterality: Right;  Needs RNFA   TUBAL LIGATION Bilateral    VARICOSE VEIN SURGERY Bilateral    By Dr. Donnetta Hutching   WRIST SURGERY     Left    Family History  Problem Relation Age of Onset   Hypertension Mother    Heart attack Father 44   Hyperlipidemia Brother    Heart attack Maternal Grandfather    Heart attack Paternal Grandfather    Breast cancer Neg Hx    Ovarian cancer Neg Hx    Endometrial cancer Neg Hx    Colon cancer Neg Hx     Social History   Socioeconomic History   Marital status: Married    Spouse name: Not on file   Number of children: Not on file   Years of education: Not on file   Highest education level: Not on file  Occupational History   Occupation: daycare  Tobacco Use   Smoking status: Every Day    Years: 10    Types: Cigarettes   Smokeless tobacco: Never  Vaping Use   Vaping Use: Never used  Substance and Sexual Activity   Alcohol use: No   Drug use: No   Sexual activity: Not Currently  Other Topics Concern   Not on file  Social History Narrative   Not on file   Social Determinants of Health  Financial Resource Strain: Not on file  Food Insecurity: No Food Insecurity (09/08/2022)   Hunger Vital Sign    Worried About Running Out of Food in the Last Year: Never true    Ran Out of Food in the Last Year: Never true  Transportation Needs: No Transportation Needs (09/08/2022)   PRAPARE - Hydrologist (Medical): No    Lack of Transportation (Non-Medical): No  Physical Activity: Not on file  Stress: Not on file  Social Connections: Moderately Isolated (09/08/2022)   Social Connection and Isolation Panel [NHANES]    Frequency of Communication with Friends and Family: More than three times a week    Frequency of Social Gatherings with Friends and  Family: More than three times a week    Attends Religious Services: Never    Marine scientist or Organizations: No    Attends Music therapist: Never    Marital Status: Married    Current Medications:  Current Outpatient Medications:    albuterol (VENTOLIN HFA) 108 (90 Base) MCG/ACT inhaler, Inhale 2 puffs into the lungs every 6 (six) hours as needed for wheezing or shortness of breath., Disp: , Rfl:    cetirizine (ZYRTEC) 10 MG tablet, Take 10 mg by mouth daily., Disp: , Rfl:    diclofenac (VOLTAREN) 75 MG EC tablet, Take 75 mg by mouth 2 (two) times daily as needed for mild pain or moderate pain., Disp: , Rfl:    Enalapril-Hydrochlorothiazide 5-12.5 MG tablet, Take 1 tablet by mouth daily., Disp: , Rfl: 2   methimazole (TAPAZOLE) 5 MG tablet, Take 5 mg by mouth daily., Disp: , Rfl: 1   montelukast (SINGULAIR) 10 MG tablet, Take 10 mg by mouth at bedtime., Disp: , Rfl:    omeprazole (PRILOSEC) 40 MG capsule, Take 40 mg by mouth 2 (two) times daily., Disp: , Rfl: 1   oxybutynin (DITROPAN-XL) 10 MG 24 hr tablet, Take 10 mg by mouth daily., Disp: , Rfl:    oxyCODONE (OXY IR/ROXICODONE) 5 MG immediate release tablet, Take 1 tablet (5 mg total) by mouth every 4 (four) hours as needed for severe pain. For AFTER surgery only, do not take and drive, Disp: 10 tablet, Rfl: 0   rosuvastatin (CRESTOR) 10 MG tablet, Take 1 tablet (10 mg total) by mouth daily. SCHEDULE OFFICE VISIT FOR REFILLS, Disp: 30 tablet, Rfl: 0   senna-docusate (SENOKOT-S) 8.6-50 MG tablet, Take 2 tablets by mouth at bedtime. For AFTER surgery, do not take if having diarrhea, Disp: 30 tablet, Rfl: 0   venlafaxine XR (EFFEXOR-XR) 150 MG 24 hr capsule, Take 150 mg by mouth daily., Disp: , Rfl:   Review of Symptoms: Complete 10-system review is negative except as above in Interval History.  Physical Exam: BP (!) 140/87 (BP Location: Left Arm, Patient Position: Sitting)   Pulse 84   Temp 97.7 F (36.5 C) (Oral)    Resp (!) 0   Ht 5\' 3"  (1.6 m)   Wt 224 lb (101.6 kg)   SpO2 100%   BMI 39.68 kg/m  General: Alert, oriented, no acute distress. HEENT: Normocephalic, atraumatic. Neck symmetric without masses. Sclera anicteric.  Chest: Normal work of breathing. Clear to auscultation bilaterally.   Cardiovascular: Regular rate and rhythm, no murmurs. Abdomen: Soft, nontender.  Normoactive bowel sounds.  No masses or hepatosplenomegaly appreciated.  Well-healing incisions. Extremities: Grossly normal range of motion.  Warm, well perfused.  No edema bilaterally. Skin: No rashes or lesions noted.  Laboratory &  Radiologic Studies: Surgical pathology (10/05/22): FINAL MICROSCOPIC DIAGNOSIS:  A. OVARY AND FALLOPIAN TUBE, RIGHT, SALPINGO OOPHORECTOMY:  Benign ovarian fibroma.  Surface serous adenofibroma.  Negative for borderline change or malignancy.  Fallopian tube with endometriosis.   B. OVARY AND FALLOPIAN TUBE, LEFT, SALPINGO OOPHORECTOMY:  Benign ovarian fibroma.  Negative for malignancy.  Benign fallopian tube.   C. PERITONEAL, RIGHT PELVIC, BIOPSY:  Endometriosis.  Negative for malignancy.   D. BLADDER, PERITONEAL, BIOPSY:  Endometriosis.  Negative for malignancy.   E. PERITONEAL, LEFT PELVIC SIDEWALL AND REMINENT ROUND, BIOPSY:  Benign smooth muscle and peritoneum.  Negative for endometriosis or malignancy.   Cytology (10/05/22): FINAL MICROSCOPIC DIAGNOSIS:  - No malignant cells identified

## 2022-10-27 ENCOUNTER — Encounter: Payer: Self-pay | Admitting: Psychiatry

## 2023-01-26 DIAGNOSIS — Z01818 Encounter for other preprocedural examination: Secondary | ICD-10-CM | POA: Diagnosis not present

## 2023-05-26 ENCOUNTER — Ambulatory Visit: Payer: 59 | Admitting: Family Medicine

## 2023-09-13 ENCOUNTER — Other Ambulatory Visit: Payer: Self-pay | Admitting: Family

## 2023-09-13 DIAGNOSIS — Z1231 Encounter for screening mammogram for malignant neoplasm of breast: Secondary | ICD-10-CM

## 2023-10-21 ENCOUNTER — Inpatient Hospital Stay: Admission: RE | Admit: 2023-10-21 | Payer: 59 | Source: Ambulatory Visit

## 2023-11-08 ENCOUNTER — Ambulatory Visit
Admission: RE | Admit: 2023-11-08 | Discharge: 2023-11-08 | Disposition: A | Discharge status: Home / Self Care | Source: Ambulatory Visit | Attending: Family | Admitting: Family

## 2023-11-08 DIAGNOSIS — Z1231 Encounter for screening mammogram for malignant neoplasm of breast: Secondary | ICD-10-CM
